# Patient Record
Sex: Female | Born: 1950 | Race: Black or African American | Hispanic: No | Marital: Single | Smoking: Current every day smoker
Health system: Southern US, Community
[De-identification: ages and names within clinical notes are randomized; demographics above are authoritative.]

## PROBLEM LIST (undated history)

## (undated) DIAGNOSIS — D126 Benign neoplasm of colon, unspecified: Secondary | ICD-10-CM

## (undated) DIAGNOSIS — I10 Essential (primary) hypertension: Secondary | ICD-10-CM

## (undated) DIAGNOSIS — K5790 Diverticulosis of intestine, part unspecified, without perforation or abscess without bleeding: Secondary | ICD-10-CM

## (undated) DIAGNOSIS — K759 Inflammatory liver disease, unspecified: Secondary | ICD-10-CM

## (undated) HISTORY — PX: TUBAL LIGATION: SHX77

---

## 2014-10-01 ENCOUNTER — Emergency Department
Admission: EM | Admit: 2014-10-01 | Discharge: 2014-10-01 | Disposition: A | Discharge status: Home / Self Care | Payer: No Typology Code available for payment source | Attending: Emergency Medicine | Admitting: Emergency Medicine

## 2014-10-01 ENCOUNTER — Encounter: Payer: Self-pay | Admitting: Emergency Medicine

## 2014-10-01 DIAGNOSIS — Z720 Tobacco use: Secondary | ICD-10-CM | POA: Diagnosis not present

## 2014-10-01 DIAGNOSIS — K625 Hemorrhage of anus and rectum: Secondary | ICD-10-CM | POA: Diagnosis present

## 2014-10-01 DIAGNOSIS — K649 Unspecified hemorrhoids: Secondary | ICD-10-CM | POA: Insufficient documentation

## 2014-10-01 MED ORDER — HYDROCORTISONE 2.5 % RE CREA: 1.0000 "application " | TOPICAL_CREAM | Freq: Two times a day (BID) | RECTAL | Status: DC

## 2014-10-01 NOTE — ED Notes (Signed)
Pt presents with constipation and some rectal bleeding since Monday, only when she wipes.

## 2014-10-01 NOTE — Discharge Instructions (Signed)
Please seek medical attention for any high fevers, chest pain, shortness of breath, change in behavior, persistent vomiting, bloody stool or any other new or concerning symptoms.    Hemorrhoids Hemorrhoids are swollen veins around the rectum or anus. There are two types of hemorrhoids:   Internal hemorrhoids. These occur in the veins just inside the rectum. They may poke through to the outside and become irritated and painful.  External hemorrhoids. These occur in the veins outside the anus and can be felt as a painful swelling or hard lump near the anus. CAUSES  Pregnancy.   Obesity.   Constipation or diarrhea.   Straining to have a bowel movement.   Sitting for long periods on the toilet.  Heavy lifting or other activity that caused you to strain.  Anal intercourse. SYMPTOMS   Pain.   Anal itching or irritation.   Rectal bleeding.   Fecal leakage.   Anal swelling.   One or more lumps around the anus.  DIAGNOSIS  Your caregiver may be able to diagnose hemorrhoids by visual examination. Other examinations or tests that may be performed include:   Examination of the rectal area with a gloved hand (digital rectal exam).   Examination of anal canal using a small tube (scope).   A blood test if you have lost a significant amount of blood.  A test to look inside the colon (sigmoidoscopy or colonoscopy). TREATMENT Most hemorrhoids can be treated at home. However, if symptoms do not seem to be getting better or if you have a lot of rectal bleeding, your caregiver may perform a procedure to help make the hemorrhoids get smaller or remove them completely. Possible treatments include:   Placing a rubber band at the base of the hemorrhoid to cut off the circulation (rubber band ligation).   Injecting a chemical to shrink the hemorrhoid (sclerotherapy).   Using a tool to burn the hemorrhoid (infrared light therapy).   Surgically removing the hemorrhoid  (hemorrhoidectomy).   Stapling the hemorrhoid to block blood flow to the tissue (hemorrhoid stapling).  HOME CARE INSTRUCTIONS   Eat foods with fiber, such as whole grains, beans, nuts, fruits, and vegetables. Ask your doctor about taking products with added fiber in them (fibersupplements).  Increase fluid intake. Drink enough water and fluids to keep your urine clear or pale yellow.   Exercise regularly.   Go to the bathroom when you have the urge to have a bowel movement. Do not wait.   Avoid straining to have bowel movements.   Keep the anal area dry and clean. Use wet toilet paper or moist towelettes after a bowel movement.   Medicated creams and suppositories may be used or applied as directed.   Only take over-the-counter or prescription medicines as directed by your caregiver.   Take warm sitz baths for 15-20 minutes, 3-4 times a day to ease pain and discomfort.   Place ice packs on the hemorrhoids if they are tender and swollen. Using ice packs between sitz baths may be helpful.   Put ice in a plastic bag.   Place a towel between your skin and the bag.   Leave the ice on for 15-20 minutes, 3-4 times a day.   Do not use a donut-shaped pillow or sit on the toilet for long periods. This increases blood pooling and pain.  SEEK MEDICAL CARE IF:  You have increasing pain and swelling that is not controlled by treatment or medicine.  You have uncontrolled bleeding.  You  have difficulty or you are unable to have a bowel movement.  You have pain or inflammation outside the area of the hemorrhoids. MAKE SURE YOU:  Understand these instructions.  Will watch your condition.  Will get help right away if you are not doing well or get worse. Document Released: 12/25/1999 Document Revised: 12/14/2011 Document Reviewed: 11/01/2011 [facility] Patient Information 2015 [place], [place]. This information is not intended to replace advice given to you by your health  care provider. Make sure you discuss any questions you have with your health care provider.

## 2014-10-01 NOTE — ED Provider Notes (Signed)
[facility] Emergency Department Provider Note   ____________________________________________  Time seen: 0810  I have reviewed the triage vital signs and the nursing notes.   HISTORY  Chief Complaint Rectal Bleeding and Constipation   History limited by: Not Limited   HPI Claudia Pittman is a 64 y.o. female who presents to the emergency department today with some concerns for GI bleed. Patient states she first noticed bright red blood on toilet paper and in her underwear on Monday. She denies any pain. She states that it is been a small amount. She denies having GI bleed in the past. She does admit to having issues with constipation. She does admit to straining whilst on the commode. She states that she does spend a prolonged time in the bathroom frequently. She does not have any nausea or vomiting. Denies any abdominal pain. Denies any fevers.   History reviewed. No pertinent past medical history.  There are no active problems to display for this patient.   History reviewed. No pertinent past surgical history.  No current outpatient prescriptions on file.  Allergies Review of patient's allergies indicates no known allergies.  No family history on file.  Social History Social History  Substance Use Topics  . Smoking status: Current Every Day Smoker  . Smokeless tobacco: None  . Alcohol Use: Yes    Review of Systems  Constitutional: Negative for fever. Cardiovascular: Negative for chest pain. Respiratory: Negative for shortness of breath. Gastrointestinal: Negative for abdominal pain, vomiting and diarrhea. Positive for rectal bleeding Genitourinary: Negative for dysuria. Musculoskeletal: Negative for back pain. Skin: Negative for rash. Neurological: Negative for headaches, focal weakness or numbness.  10-point ROS otherwise negative.  ____________________________________________   PHYSICAL EXAM:  VITAL SIGNS: ED Triage Vitals   Enc Vitals Group     BP 10/01/14 0750 161/103 mmHg     Pulse Rate 10/01/14 0750 71     Resp 10/01/14 0750 20     Temp 10/01/14 0750 98.9 F (37.2 C)     Temp Source 10/01/14 0750 Oral     SpO2 10/01/14 0750 97 %     Weight 10/01/14 0750 200 lb (90.719 kg)     Height 10/01/14 0750 5\' 3"  (1.6 m)   Constitutional: Alert and oriented. Well appearing and in no distress. Eyes: Conjunctivae are normal. PERRL. Normal extraocular movements. ENT   Head: Normocephalic and atraumatic.   Nose: No congestion/rhinnorhea.   Mouth/Throat: Mucous membranes are moist.   Neck: No stridor. Hematological/Lymphatic/Immunilogical: No cervical lymphadenopathy. Cardiovascular: Normal rate, regular rhythm.  No murmurs, rubs, or gallops. Respiratory: Normal respiratory effort without tachypnea nor retractions. Breath sounds are clear and equal bilaterally. No wheezes/rales/rhonchi. Gastrointestinal: Soft and nontender. No distention.  Rectal: Positive for external bleeding hemorrhoid. Genitourinary: Deferred Musculoskeletal: Normal range of motion in all extremities. No joint effusions.  No lower extremity tenderness nor edema. Neurologic:  Normal speech and language. No gross focal neurologic deficits are appreciated. Speech is normal.  Skin:  Skin is warm, dry and intact. No rash noted. Psychiatric: Mood and affect are normal. Speech and behavior are normal. Patient exhibits appropriate insight and judgment.  ____________________________________________    LABS (pertinent positives/negatives)  None  ____________________________________________   EKG  None  ____________________________________________    RADIOLOGY  None  ____________________________________________   PROCEDURES  Procedure(s) performed: None  Critical Care performed: No  ____________________________________________   INITIAL IMPRESSION / ASSESSMENT AND PLAN / ED COURSE  Pertinent labs & imaging  results that were available during  my care of the patient were reviewed by me and considered in my medical decision making (see chart for details).  Patient presents to the emergency department today with GI bleeding. Patient certainly does have bursitis for hemorrhoids. On physical exam patient did have a bleeding hemorrhoid. It was nontender. I discussed this with the patient. Discussed hemorrhoid care with patient.  ____________________________________________   FINAL CLINICAL IMPRESSION(S) / ED DIAGNOSES  Final diagnoses:  Bleeding hemorrhoid     Nance Pear, MD 10/01/14 1021

## 2015-04-15 ENCOUNTER — Other Ambulatory Visit: Payer: Self-pay | Admitting: Internal Medicine

## 2015-04-15 DIAGNOSIS — Z1239 Encounter for other screening for malignant neoplasm of breast: Secondary | ICD-10-CM

## 2015-04-15 DIAGNOSIS — R748 Abnormal levels of other serum enzymes: Secondary | ICD-10-CM

## 2015-04-23 ENCOUNTER — Ambulatory Visit
Admission: RE | Admit: 2015-04-23 | Discharge: 2015-04-23 | Disposition: A | Discharge status: Home / Self Care | Payer: Medicare Other | Source: Ambulatory Visit | Attending: Internal Medicine | Admitting: Internal Medicine

## 2015-04-23 DIAGNOSIS — R748 Abnormal levels of other serum enzymes: Secondary | ICD-10-CM | POA: Insufficient documentation

## 2015-04-23 DIAGNOSIS — R933 Abnormal findings on diagnostic imaging of other parts of digestive tract: Secondary | ICD-10-CM | POA: Diagnosis not present

## 2015-04-23 DIAGNOSIS — R932 Abnormal findings on diagnostic imaging of liver and biliary tract: Secondary | ICD-10-CM | POA: Diagnosis not present

## 2015-05-04 ENCOUNTER — Ambulatory Visit: Payer: No Typology Code available for payment source | Attending: Internal Medicine

## 2015-05-19 ENCOUNTER — Other Ambulatory Visit: Payer: Self-pay | Admitting: Gastroenterology

## 2015-05-19 DIAGNOSIS — R7989 Other specified abnormal findings of blood chemistry: Secondary | ICD-10-CM

## 2015-05-19 DIAGNOSIS — R945 Abnormal results of liver function studies: Principal | ICD-10-CM

## 2015-05-22 ENCOUNTER — Other Ambulatory Visit: Payer: No Typology Code available for payment source

## 2015-06-05 ENCOUNTER — Other Ambulatory Visit: Payer: Self-pay | Admitting: Gastroenterology

## 2015-06-05 ENCOUNTER — Ambulatory Visit
Admission: RE | Admit: 2015-06-05 | Discharge: 2015-06-05 | Disposition: A | Discharge status: Home / Self Care | Payer: Medicare Other | Source: Ambulatory Visit | Attending: Gastroenterology | Admitting: Gastroenterology

## 2015-06-05 DIAGNOSIS — R932 Abnormal findings on diagnostic imaging of liver and biliary tract: Secondary | ICD-10-CM | POA: Diagnosis not present

## 2015-06-05 DIAGNOSIS — B182 Chronic viral hepatitis C: Secondary | ICD-10-CM | POA: Insufficient documentation

## 2015-06-05 DIAGNOSIS — R945 Abnormal results of liver function studies: Secondary | ICD-10-CM | POA: Diagnosis not present

## 2015-06-05 DIAGNOSIS — R7989 Other specified abnormal findings of blood chemistry: Secondary | ICD-10-CM

## 2015-07-02 ENCOUNTER — Encounter: Payer: Self-pay | Admitting: *Deleted

## 2015-07-03 ENCOUNTER — Ambulatory Visit
Admission: RE | Admit: 2015-07-03 | Discharge: 2015-07-03 | Disposition: A | Discharge status: Home / Self Care | Payer: Medicare Other | Source: Ambulatory Visit | Attending: Gastroenterology | Admitting: Gastroenterology

## 2015-07-03 ENCOUNTER — Ambulatory Visit: Payer: Medicare Other | Admitting: Anesthesiology

## 2015-07-03 ENCOUNTER — Encounter
Admission: RE | Disposition: A | Discharge status: Home / Self Care | Payer: Self-pay | Source: Ambulatory Visit | Attending: Gastroenterology

## 2015-07-03 DIAGNOSIS — D125 Benign neoplasm of sigmoid colon: Secondary | ICD-10-CM | POA: Diagnosis not present

## 2015-07-03 DIAGNOSIS — D121 Benign neoplasm of appendix: Secondary | ICD-10-CM | POA: Insufficient documentation

## 2015-07-03 DIAGNOSIS — I10 Essential (primary) hypertension: Secondary | ICD-10-CM | POA: Diagnosis not present

## 2015-07-03 DIAGNOSIS — D123 Benign neoplasm of transverse colon: Secondary | ICD-10-CM | POA: Insufficient documentation

## 2015-07-03 DIAGNOSIS — Z8371 Family history of colonic polyps: Secondary | ICD-10-CM | POA: Diagnosis not present

## 2015-07-03 DIAGNOSIS — Z1211 Encounter for screening for malignant neoplasm of colon: Secondary | ICD-10-CM | POA: Insufficient documentation

## 2015-07-03 DIAGNOSIS — K635 Polyp of colon: Secondary | ICD-10-CM | POA: Diagnosis not present

## 2015-07-03 DIAGNOSIS — D122 Benign neoplasm of ascending colon: Secondary | ICD-10-CM | POA: Diagnosis not present

## 2015-07-03 DIAGNOSIS — F17200 Nicotine dependence, unspecified, uncomplicated: Secondary | ICD-10-CM | POA: Insufficient documentation

## 2015-07-03 DIAGNOSIS — K5730 Diverticulosis of large intestine without perforation or abscess without bleeding: Secondary | ICD-10-CM | POA: Diagnosis not present

## 2015-07-03 HISTORY — PX: COLONOSCOPY WITH PROPOFOL: SHX5780

## 2015-07-03 HISTORY — DX: Inflammatory liver disease, unspecified: K75.9

## 2015-07-03 HISTORY — DX: Essential (primary) hypertension: I10

## 2015-07-03 LAB — CBC
HCT: 43.3 % (ref 35.0–47.0)
Hemoglobin: 14.6 g/dL (ref 12.0–16.0)
MCH: 32.6 pg (ref 26.0–34.0)
MCHC: 33.6 g/dL (ref 32.0–36.0)
MCV: 97.1 fL (ref 80.0–100.0)
PLATELETS: 136 10*3/uL — AB (ref 150–440)
RBC: 4.46 MIL/uL (ref 3.80–5.20)
RDW: 13.8 % (ref 11.5–14.5)
WBC: 6.5 10*3/uL (ref 3.6–11.0)

## 2015-07-03 LAB — PROTIME-INR
INR: 1.14
PROTHROMBIN TIME: 14.8 s (ref 11.4–15.0)

## 2015-07-03 SURGERY — COLONOSCOPY WITH PROPOFOL
Anesthesia: General

## 2015-07-03 MED ORDER — LIDOCAINE HCL (PF) 2 % IJ SOLN
INTRAMUSCULAR | Status: DC | PRN
Start: 2015-07-03 — End: 2015-07-03
  Administered 2015-07-03 (×2): 100 mg via INTRADERMAL

## 2015-07-03 MED ORDER — PHENYLEPHRINE HCL 10 MG/ML IJ SOLN
INTRAMUSCULAR | Status: DC | PRN
  Administered 2015-07-03: 100 ug via INTRAVENOUS

## 2015-07-03 MED ORDER — PROPOFOL 10 MG/ML IV BOLUS
INTRAVENOUS | Status: DC | PRN
  Administered 2015-07-03: 70 mg via INTRAVENOUS

## 2015-07-03 MED ORDER — SODIUM CHLORIDE 0.9 % IV SOLN
INTRAVENOUS | Status: DC
  Administered 2015-07-03: 1000 mL via INTRAVENOUS

## 2015-07-03 MED ORDER — SODIUM CHLORIDE 0.9 % IV SOLN: INTRAVENOUS | Status: DC

## 2015-07-03 MED ORDER — PROPOFOL 500 MG/50ML IV EMUL
INTRAVENOUS | Status: DC | PRN
  Administered 2015-07-03: 125 ug/kg/min via INTRAVENOUS

## 2015-07-03 NOTE — Anesthesia Preprocedure Evaluation (Addendum)
Anesthesia Evaluation  Patient identified by MRN, date of birth, ID band Patient awake    Reviewed: Allergy & Precautions, NPO status , Patient's Chart, lab work & pertinent test results  History of Anesthesia Complications Negative for: history of anesthetic complications  Airway Mallampati: III       Dental  (+) Upper Dentures, Lower Dentures   Pulmonary neg pulmonary ROS, Current Smoker,           Cardiovascular hypertension, Pt. on medications      Neuro/Psych negative neurological ROS     GI/Hepatic negative GI ROS, (+) Hepatitis -, C  Endo/Other  negative endocrine ROS  Renal/GU negative Renal ROS     Musculoskeletal   Abdominal   Peds  Hematology negative hematology ROS (+)   Anesthesia Other Findings   Reproductive/Obstetrics                            Anesthesia Physical Anesthesia Plan  ASA: II  Anesthesia Plan: General   Post-op Pain Management:    Induction: Intravenous  Airway Management Planned: Nasal Cannula  Additional Equipment:   Intra-op Plan:   Post-operative Plan:   Informed Consent: I have reviewed the patients History and Physical, chart, labs and discussed the procedure including the risks, benefits and alternatives for the proposed anesthesia with the patient or authorized representative who has indicated his/her understanding and acceptance.     Plan Discussed with:   Anesthesia Plan Comments:         Anesthesia Quick Evaluation

## 2015-07-03 NOTE — Transfer of Care (Signed)
Immediate Anesthesia Transfer of Care Note  Patient: Claudia Pittman  Procedure(s) Performed: Procedure(s): COLONOSCOPY WITH PROPOFOL (N/A)  Patient Location: PACU  Anesthesia Type:General  Level of Consciousness: sedated  Airway & Oxygen Therapy: Patient Spontanous Breathing and Patient connected to nasal cannula oxygen  Post-op Assessment: Report given to RN and Post -op Vital signs reviewed and stable  Post vital signs: Reviewed and stable  Last Vitals:  Filed Vitals:   07/03/15 0853 07/03/15 1142  BP: 114/75 112/76  Pulse: 70 62  Temp: 35.9 C   Resp: 18 18    Last Pain: There were no vitals filed for this visit.       Complications: No apparent anesthesia complications

## 2015-07-03 NOTE — Anesthesia Postprocedure Evaluation (Signed)
Anesthesia Post Note  Patient: Claudia Pittman  Procedure(s) Performed: Procedure(s) (LRB): COLONOSCOPY WITH PROPOFOL (N/A)  Patient location during evaluation: Endoscopy Anesthesia Type: General Level of consciousness: awake and alert Pain management: pain level controlled Vital Signs Assessment: post-procedure vital signs reviewed and stable Respiratory status: spontaneous breathing and respiratory function stable Cardiovascular status: stable Anesthetic complications: no    Last Vitals:  Filed Vitals:   07/03/15 1210 07/03/15 1220  BP: 116/94 146/98  Pulse: 63 56  Temp:    Resp: 15 14    Last Pain:  Filed Vitals:   07/03/15 1220  PainSc: Asleep                 Maddilyn Campus K

## 2015-07-03 NOTE — H&P (Signed)
Outpatient short stay form Pre-procedure 07/03/2015 10:25 AM Lollie Sails MD  Primary Physician: Dr. Glendon Axe  Reason for visit:  Colonoscopy  History of present illness:  Patient is a 65 year old female presenting today for colonoscopy. She has a family history of colon polyps in both. She tolerated her prep well. She takes no aspirin or blood thinning products.    Current facility-administered medications:  .  0.9 %  sodium chloride infusion, , Intravenous, Continuous, Lollie Sails, MD, Last Rate: 20 mL/hr at 07/03/15 1015, 1,000 mL at 07/03/15 1015 .  0.9 %  sodium chloride infusion, , Intravenous, Continuous, Lollie Sails, MD  Prescriptions prior to admission  Medication Sig Dispense Refill Last Dose  . amLODipine (NORVASC) 5 MG tablet Take 5 mg by mouth daily.   07/03/2015 at 0530  . Dorzolamide HCl-Timolol Mal PF 22.3-6.8 MG/ML SOLN Place 1 drop into both eyes 2 (two) times daily.   10/01/2014 at am  . latanoprost (XALATAN) 0.005 % ophthalmic solution Place 1 drop into both eyes at bedtime.   09/30/2014 at pm     No Known Allergies   Past Medical History  Diagnosis Date  . Hepatitis   . Hypertension     Review of systems:      Physical Exam    Heart and lungs: Regular rate and rhythm without rub or gallop, lungs are bilaterally clear.    HEENT: Normocephalic atraumatic eyes are anicteric    Other:     Pertinant exam for procedure: Soft nontender nondistended bowel sounds positive normoactive.    Planned proceedures: Colonoscopy and indicated procedures.  I have discussed the risks benefits and complications of procedures to include not limited to bleeding, infection, perforation and the risk of sedation and the patient wishes to proceed.    Lollie Sails, MD Gastroenterology 07/03/2015  10:25 AM

## 2015-07-03 NOTE — Op Note (Signed)
[facility] Gastroenterology Patient Name: Claudia Pittman Procedure Date: 07/03/2015 10:33 AM MRN: [id number] Account #: [id number] Date of Birth: 1950/11/24 Admit Type: Outpatient Age: 65 Room: [facility] ENDO ROOM 4 Gender: Female Note Status: Finalized Procedure:            Colonoscopy Indications:          Screening for colorectal malignant neoplasm, This is                        the patient's first colonoscopy, Family history of                        colonic polyps in a first-degree relative Providers:            Lollie Sails, MD Referring MD:         Glendon Axe (Referring MD) Medicines:            Monitored Anesthesia Care Complications:        No immediate complications. Procedure:            Pre-Anesthesia Assessment:                       - ASA Grade Assessment: II - A patient with mild                        systemic disease.                       After obtaining informed consent, the colonoscope was                        passed under direct vision. Throughout the procedure,                        the patient's blood pressure, pulse, and oxygen                        saturations were monitored continuously. The                        Colonoscope was introduced through the anus and                        advanced to the the cecum, identified by appendiceal                        orifice and ileocecal valve. The colonoscopy was                        performed with moderate difficulty. The patient                        tolerated the procedure well. The quality of the bowel                        preparation was good. Findings:      A 5 mm polyp was found in the splenic flexure. The polyp was sessile.       The polyp was removed with a cold biopsy forceps. Resection and       retrieval were complete.  A 2 mm polyp was found in the splenic flexure. The polyp was sessile.       The polyp was removed with a cold biopsy forceps. Resection and     retrieval were complete.      A 5 mm polyp was found in the distal transverse colon. The polyp was       sessile. The polyp was removed with a cold snare. Resection and       retrieval were complete.      A 38 mm polyp was found in the hepatic flexure. The polyp was       carpet-like and sessile. Polypectomy was attempted, initially using a       hot snare. Polyp resection was incomplete with this device. This       intervention then required a different device and polypectomy technique.       The polyp was removed with a cold biopsy forceps. Resection and       retrieval were complete. To prevent bleeding after the polypectomy, two       hemostatic clips were successfully placed. There was no bleeding at the       end of the maneuver.      Two sessile polyps were found in the appendiceal orifice. The polyps       were less than 1 mm in size. These polyps were removed with a cold       biopsy forceps. Resection and retrieval were complete.      A 3 mm polyp was found in the proximal ascending colon. The polyp was       sessile. The polyp was removed with a cold snare. Resection and       retrieval were complete.      Ten sessile polyps were found in the recto-sigmoid colon. The polyps       were 1 to 3 mm in size. These polyps were removed with a cold biopsy       forceps. Resection and retrieval were complete.      A few small-mouthed diverticula were found in the sigmoid colon and       descending colon.      The digital rectal exam was normal. Impression:           - One 5 mm polyp at the splenic flexure, removed with a                        cold biopsy forceps. Resected and retrieved.                       - One 2 mm polyp at the splenic flexure, removed with a                        cold biopsy forceps. Resected and retrieved.                       - One 5 mm polyp in the distal transverse colon,                        removed with a cold snare. Resected and retrieved.                        - One 38 mm polyp at the hepatic flexure, removed with  a cold biopsy forceps. Resected and retrieved. Clips                        were placed.                       - Two less than 1 mm polyps at the appendiceal orifice,                        removed with a cold biopsy forceps. Resected and                        retrieved.                       - One 3 mm polyp in the proximal ascending colon,                        removed with a cold snare. Resected and retrieved.                       - Ten 1 to 3 mm polyps at the recto-sigmoid colon,                        removed with a cold biopsy forceps. Resected and                        retrieved.                       - Diverticulosis in the sigmoid colon and in the                        descending colon. Recommendation:       - Await pathology results.                       - Telephone GI clinic for pathology results in 1 week. Procedure Code(s):    --- Professional ---                       [phone], Colonoscopy, flexible; with removal of tumor(s),                        polyp(s), or other lesion(s) by snare technique                       [id number], 54, Colonoscopy, flexible; with biopsy, single                        or multiple Diagnosis Code(s):    --- Professional ---                       Z12.11, Encounter for screening for malignant neoplasm                        of colon                       D12.3, Benign neoplasm of transverse colon (hepatic  flexure or splenic flexure)                       D12.2, Benign neoplasm of ascending colon                       D12.1, Benign neoplasm of appendix                       D12.7, Benign neoplasm of rectosigmoid junction                       Z83.71, Family history of colonic polyps                       K57.30, Diverticulosis of large intestine without                        perforation or abscess without bleeding CPT copyright 2016  American Medical Association. All rights reserved. The codes documented in this report are preliminary and upon coder review may  be revised to meet current compliance requirements. Lollie Sails, MD 07/03/2015 11:47:11 AM This report has been signed electronically. Number of Addenda: 0 Note Initiated On: 07/03/2015 10:33 AM Scope Withdrawal Time: 0 hours 38 minutes 0 seconds  Total Procedure Duration: 0 hours 58 minutes 0 seconds       [facility]

## 2015-07-03 NOTE — OR Nursing (Signed)
[place] ink applied to Hepatic flexure site 4 ml.

## 2015-07-06 ENCOUNTER — Encounter: Payer: Self-pay | Admitting: Gastroenterology

## 2015-07-07 LAB — SURGICAL PATHOLOGY

## 2015-08-20 ENCOUNTER — Other Ambulatory Visit: Payer: Self-pay | Admitting: Gastroenterology

## 2015-08-20 DIAGNOSIS — R945 Abnormal results of liver function studies: Principal | ICD-10-CM

## 2015-08-20 DIAGNOSIS — R7989 Other specified abnormal findings of blood chemistry: Secondary | ICD-10-CM

## 2015-08-27 ENCOUNTER — Other Ambulatory Visit: Payer: Self-pay | Admitting: General Surgery

## 2015-08-27 ENCOUNTER — Ambulatory Visit: Payer: Medicare Other

## 2015-08-28 ENCOUNTER — Other Ambulatory Visit: Payer: Self-pay | Admitting: Physician Assistant

## 2015-08-31 ENCOUNTER — Ambulatory Visit
Admission: RE | Admit: 2015-08-31 | Discharge: 2015-08-31 | Disposition: A | Discharge status: Home / Self Care | Payer: Medicare Other | Source: Ambulatory Visit | Attending: Gastroenterology | Admitting: Gastroenterology

## 2015-08-31 DIAGNOSIS — K7581 Nonalcoholic steatohepatitis (NASH): Secondary | ICD-10-CM | POA: Diagnosis not present

## 2015-08-31 DIAGNOSIS — B182 Chronic viral hepatitis C: Secondary | ICD-10-CM | POA: Diagnosis not present

## 2015-08-31 DIAGNOSIS — R7989 Other specified abnormal findings of blood chemistry: Secondary | ICD-10-CM

## 2015-08-31 DIAGNOSIS — R945 Abnormal results of liver function studies: Secondary | ICD-10-CM | POA: Diagnosis present

## 2015-08-31 LAB — CBC
HEMATOCRIT: 40.9 % (ref 35.0–47.0)
Hemoglobin: 13.7 g/dL (ref 12.0–16.0)
MCH: 32.9 pg (ref 26.0–34.0)
MCHC: 33.5 g/dL (ref 32.0–36.0)
MCV: 98.1 fL (ref 80.0–100.0)
PLATELETS: 112 10*3/uL — AB (ref 150–440)
RBC: 4.17 MIL/uL (ref 3.80–5.20)
RDW: 13.4 % (ref 11.5–14.5)
WBC: 6 10*3/uL (ref 3.6–11.0)

## 2015-08-31 LAB — APTT: aPTT: 35 seconds (ref 24–36)

## 2015-08-31 LAB — PROTIME-INR
INR: 1.01
Prothrombin Time: 13.3 seconds (ref 11.4–15.2)

## 2015-08-31 MED ORDER — SODIUM CHLORIDE 0.9 % IV SOLN
INTRAVENOUS | Status: DC
  Administered 2015-08-31: 09:00:00 via INTRAVENOUS

## 2015-08-31 MED ORDER — HYDROCODONE-ACETAMINOPHEN 5-325 MG PO TABS: 1.0000 | ORAL_TABLET | ORAL | Status: DC | PRN

## 2015-08-31 NOTE — Procedures (Signed)
US liver biopsy  Complications:  None  Blood Loss: none  See dictation in canopy pacs  

## 2015-09-03 LAB — SURGICAL PATHOLOGY

## 2015-09-17 ENCOUNTER — Ambulatory Visit: Payer: Medicare Other

## 2015-10-02 ENCOUNTER — Other Ambulatory Visit: Payer: Self-pay | Admitting: Internal Medicine

## 2015-10-02 ENCOUNTER — Ambulatory Visit
Admission: RE | Admit: 2015-10-02 | Discharge: 2015-10-02 | Disposition: A | Discharge status: Home / Self Care | Payer: Medicare Other | Source: Ambulatory Visit | Attending: Internal Medicine | Admitting: Internal Medicine

## 2015-10-02 DIAGNOSIS — Z1231 Encounter for screening mammogram for malignant neoplasm of breast: Secondary | ICD-10-CM | POA: Insufficient documentation

## 2015-10-02 DIAGNOSIS — Z1239 Encounter for other screening for malignant neoplasm of breast: Secondary | ICD-10-CM

## 2015-10-20 ENCOUNTER — Ambulatory Visit: Admit: 2015-10-20 | Payer: Medicare Other | Admitting: Gastroenterology

## 2015-10-20 SURGERY — COLONOSCOPY
Anesthesia: General

## 2015-10-29 ENCOUNTER — Encounter: Payer: Self-pay | Admitting: *Deleted

## 2015-10-30 ENCOUNTER — Encounter
Admission: RE | Disposition: A | Discharge status: Home / Self Care | Payer: Self-pay | Source: Ambulatory Visit | Attending: Gastroenterology

## 2015-10-30 ENCOUNTER — Ambulatory Visit
Admission: RE | Admit: 2015-10-30 | Discharge: 2015-10-30 | Disposition: A | Discharge status: Home / Self Care | Payer: Medicare Other | Source: Ambulatory Visit | Attending: Gastroenterology | Admitting: Gastroenterology

## 2015-10-30 ENCOUNTER — Ambulatory Visit: Payer: Medicare Other | Admitting: Anesthesiology

## 2015-10-30 ENCOUNTER — Encounter: Payer: Self-pay | Admitting: *Deleted

## 2015-10-30 DIAGNOSIS — F17200 Nicotine dependence, unspecified, uncomplicated: Secondary | ICD-10-CM | POA: Insufficient documentation

## 2015-10-30 DIAGNOSIS — D124 Benign neoplasm of descending colon: Secondary | ICD-10-CM | POA: Insufficient documentation

## 2015-10-30 DIAGNOSIS — B182 Chronic viral hepatitis C: Secondary | ICD-10-CM | POA: Insufficient documentation

## 2015-10-30 DIAGNOSIS — K621 Rectal polyp: Secondary | ICD-10-CM | POA: Diagnosis not present

## 2015-10-30 DIAGNOSIS — K5730 Diverticulosis of large intestine without perforation or abscess without bleeding: Secondary | ICD-10-CM | POA: Diagnosis not present

## 2015-10-30 DIAGNOSIS — D123 Benign neoplasm of transverse colon: Secondary | ICD-10-CM | POA: Insufficient documentation

## 2015-10-30 DIAGNOSIS — Z1211 Encounter for screening for malignant neoplasm of colon: Secondary | ICD-10-CM | POA: Insufficient documentation

## 2015-10-30 DIAGNOSIS — K7460 Unspecified cirrhosis of liver: Secondary | ICD-10-CM | POA: Insufficient documentation

## 2015-10-30 DIAGNOSIS — Z79899 Other long term (current) drug therapy: Secondary | ICD-10-CM | POA: Diagnosis not present

## 2015-10-30 DIAGNOSIS — I10 Essential (primary) hypertension: Secondary | ICD-10-CM | POA: Insufficient documentation

## 2015-10-30 DIAGNOSIS — K635 Polyp of colon: Secondary | ICD-10-CM | POA: Insufficient documentation

## 2015-10-30 DIAGNOSIS — Z86010 Personal history of colonic polyps: Secondary | ICD-10-CM | POA: Insufficient documentation

## 2015-10-30 HISTORY — PX: COLONOSCOPY: SHX5424

## 2015-10-30 HISTORY — DX: Diverticulosis of intestine, part unspecified, without perforation or abscess without bleeding: K57.90

## 2015-10-30 SURGERY — COLONOSCOPY
Anesthesia: General

## 2015-10-30 MED ORDER — FENTANYL CITRATE (PF) 100 MCG/2ML IJ SOLN
INTRAMUSCULAR | Status: DC | PRN
  Administered 2015-10-30: 50 ug via INTRAVENOUS

## 2015-10-30 MED ORDER — SODIUM CHLORIDE 0.9 % IV SOLN
INTRAVENOUS | Status: DC
  Administered 2015-10-30: 1000 mL via INTRAVENOUS
  Administered 2015-10-30: 10:00:00 via INTRAVENOUS

## 2015-10-30 MED ORDER — PROPOFOL 10 MG/ML IV BOLUS
INTRAVENOUS | Status: DC | PRN
  Administered 2015-10-30 (×2): 20 mg via INTRAVENOUS

## 2015-10-30 MED ORDER — SPOT INK MARKER SYRINGE KIT
PACK | SUBMUCOSAL | Status: DC | PRN
  Administered 2015-10-30: 2 mL via SUBMUCOSAL

## 2015-10-30 MED ORDER — PHENYLEPHRINE HCL 10 MG/ML IJ SOLN
INTRAMUSCULAR | Status: DC | PRN
  Administered 2015-10-30 (×4): 100 ug via INTRAVENOUS

## 2015-10-30 MED ORDER — LIDOCAINE HCL (CARDIAC) 20 MG/ML IV SOLN
INTRAVENOUS | Status: DC | PRN
  Administered 2015-10-30: 40 mg via INTRATRACHEAL
  Administered 2015-10-30: 200 mg via INTRATRACHEAL

## 2015-10-30 MED ORDER — MIDAZOLAM HCL 2 MG/2ML IJ SOLN
INTRAMUSCULAR | Status: DC | PRN
  Administered 2015-10-30: .5 mg via INTRAVENOUS

## 2015-10-30 MED ORDER — SODIUM CHLORIDE 0.9 % IV SOLN: INTRAVENOUS | Status: DC

## 2015-10-30 MED ORDER — PROPOFOL 500 MG/50ML IV EMUL
INTRAVENOUS | Status: DC | PRN
  Administered 2015-10-30: 140 ug/kg/min via INTRAVENOUS

## 2015-10-30 NOTE — Anesthesia Procedure Notes (Signed)
Date/Time: 10/30/2015 10:20 AM Performed by: Allean Found Pre-anesthesia Checklist: Patient being monitored, Timeout performed, Suction available, Emergency Drugs available and Patient identified Patient Re-evaluated:Patient Re-evaluated prior to inductionOxygen Delivery Method: Nasal cannula Placement Confirmation: positive ETCO2

## 2015-10-30 NOTE — Anesthesia Preprocedure Evaluation (Signed)
Anesthesia Evaluation  Patient identified by MRN, date of birth, ID band Patient awake    Reviewed: Allergy & Precautions, H&P , NPO status , Patient's Chart, lab work & pertinent test results  History of Anesthesia Complications Negative for: history of anesthetic complications  Airway Mallampati: III  TM Distance: <3 FB Neck ROM: limited    Dental  (+) Poor Dentition, Missing, Upper Dentures, Lower Dentures   Pulmonary Current Smoker,    Pulmonary exam normal breath sounds clear to auscultation       Cardiovascular Exercise Tolerance: Good hypertension, (-) angina(-) Past MI and (-) DOE Normal cardiovascular exam Rhythm:regular Rate:Normal     Neuro/Psych negative neurological ROS  negative psych ROS   GI/Hepatic negative GI ROS, neg GERD  ,(+) Hepatitis -, C  Endo/Other  negative endocrine ROS  Renal/GU negative Renal ROS  negative genitourinary   Musculoskeletal   Abdominal   Peds  Hematology negative hematology ROS (+)   Anesthesia Other Findings Past Medical History: No date: Diverticulosis No date: Hepatitis     Comment: hep c No date: Hypertension  Past Surgical History: 07/03/2015: COLONOSCOPY WITH PROPOFOL N/A     Comment: Procedure: COLONOSCOPY WITH PROPOFOL;                Surgeon: Lollie Sails, MD;  Location: [facility]              ENDOSCOPY;  Service: Endoscopy;  Laterality:               N/A; 1980s: TUBAL LIGATION     Reproductive/Obstetrics negative OB ROS                             Anesthesia Physical Anesthesia Plan  ASA: III  Anesthesia Plan: General   Post-op Pain Management:    Induction:   Airway Management Planned:   Additional Equipment:   Intra-op Plan:   Post-operative Plan:   Informed Consent: I have reviewed the patients History and Physical, chart, labs and discussed the procedure including the risks, benefits and alternatives for the  proposed anesthesia with the patient or authorized representative who has indicated his/her understanding and acceptance.   Dental Advisory Given  Plan Discussed with: Anesthesiologist, CRNA and Surgeon  Anesthesia Plan Comments:         Anesthesia Quick Evaluation

## 2015-10-30 NOTE — Op Note (Signed)
[facility] Gastroenterology Patient Name: Claudia Pittman Procedure Date: 10/30/2015 10:16 AM MRN: [id number] Account #: [id number] Date of Birth: 1950-06-04 Admit Type: Outpatient Age: 65 Room: [facility] ENDO ROOM 3 Gender: Female Note Status: Finalized Procedure:            Colonoscopy Indications:          Personal history of colonic polyps Providers:            Lollie Sails, MD Referring MD:         Glendon Axe (Referring MD) Medicines:            Monitored Anesthesia Care Complications:        No immediate complications. Procedure:            Pre-Anesthesia Assessment:                       - ASA Grade Assessment: III - A patient with severe                        systemic disease.                       After obtaining informed consent, the colonoscope was                        passed under direct vision. Throughout the procedure,                        the patient's blood pressure, pulse, and oxygen                        saturations were monitored continuously. The                        Colonoscope was introduced through the anus and                        advanced to the the cecum, identified by appendiceal                        orifice and ileocecal valve. The colonoscopy was                        performed without difficulty. The patient tolerated the                        procedure well. The quality of the bowel preparation                        was good. Findings:      A 2 mm polyp was found in the descending colon. The polyp was sessile.       The polyp was removed with a cold biopsy forceps. Resection and       retrieval were complete.      A 3 mm polyp was found in the proximal transverse colon. The polyp was       sessile. The polyp was removed with a cold biopsy forceps. Resection and       retrieval were complete.      A 4 mm polyp was found in the hepatic flexure. The polyp was flat. The  polyp was removed with a cold snare.  Resection and retrieval were       complete.      A polypectomy scar is found in the hepatic flexure, with an adjacent ink       marking. There was no evidence of recurrent polyp, howeverr some mucose       appeared erythematous and prominant. The was removed with a cold snare       and forcep. Area was re-tattooed with an injection of 2 mL of [place] ink.      Two sessile polyps were found in the hepatic flexure. The polyps were 1       to 2 mm in size. These polyps were removed with a cold biopsy forceps.       Resection and retrieval were complete.      Three sessile polyps were found in the proximal descending colon. The       polyps were 1 to 2 mm in size. These polyps were removed with a cold       biopsy forceps. Resection and retrieval were complete.      A 1 mm polyp was found in the sigmoid colon. The polyp was sessile. The       polyp was removed with a cold biopsy forceps. Resection and retrieval       were complete.      Five sessile polyps were found in the rectum. The polyps were 1 to 2 mm       in size. These polyps were removed with a cold biopsy forceps. Resection       and retrieval were complete.      A 4 mm polyp was found in the rectum. The polyp was sessile. The polyp       was removed with a cold snare. Resection and retrieval were complete.      A few small-mouthed diverticula were found in the sigmoid colon and       descending colon.      The digital rectal exam was normal.      A single small localized angioectasia without bleeding was found in the       proximal transverse colon. Impression:           - One 2 mm polyp in the descending colon, removed with                        a cold biopsy forceps. Resected and retrieved.                       - One 3 mm polyp in the proximal transverse colon,                        removed with a cold biopsy forceps. Resected and                        retrieved.                       - One 4 mm polyp at the hepatic  flexure, removed with a                        cold snare. Resected and retrieved.                       -  Two 1 to 2 mm polyps at the hepatic flexure, removed                        with a cold biopsy forceps. Resected and retrieved.                       - Three 1 to 2 mm polyps in the proximal descending                        colon, removed with a cold biopsy forceps. Resected and                        retrieved.                       - One 1 mm polyp in the sigmoid colon, removed with a                        cold biopsy forceps. Resected and retrieved.                       - Five 1 to 2 mm polyps in the rectum, removed with a                        cold biopsy forceps. Resected and retrieved.                       - One 4 mm polyp in the rectum, removed with a cold                        snare. Resected and retrieved.                       - Diverticulosis in the sigmoid colon and in the                        descending colon. Recommendation:       - Return to GI clinic in 4 weeks.                       - Await pathology results. Procedure Code(s):    --- Professional ---                       [phone], Colonoscopy, flexible; with removal of tumor(s),                        polyp(s), or other lesion(s) by snare technique                       45380, 59, Colonoscopy, flexible; with biopsy, single                        or multiple                       45381, Colonoscopy, flexible; with directed submucosal                        injection(s), any substance Diagnosis Code(s):    --- Professional ---  D12.4, Benign neoplasm of descending colon                       D12.3, Benign neoplasm of transverse colon (hepatic                        flexure or splenic flexure)                       D12.5, Benign neoplasm of sigmoid colon                       K62.1, Rectal polyp                       Z86.010, Personal history of colonic polyps                       K57.30,  Diverticulosis of large intestine without                        perforation or abscess without bleeding CPT copyright 2016 American Medical Association. All rights reserved. The codes documented in this report are preliminary and upon coder review may  be revised to meet current compliance requirements. Lollie Sails, MD 10/30/2015 11:30:29 AM This report has been signed electronically. Number of Addenda: 0 Note Initiated On: 10/30/2015 10:16 AM Scope Withdrawal Time: 0 hours 35 minutes 14 seconds  Total Procedure Duration: 0 hours 47 minutes 26 seconds       [facility]

## 2015-10-30 NOTE — H&P (Signed)
Outpatient short stay form Pre-procedure 10/30/2015 10:17 AM Lollie Sails MD  Primary Physician: Dr. Glendon Axe  Reason for visit:  Colonoscopy  History of present illness:  Patient is a 65 year old female presenting today as above. She had a colonoscopy on 07/03/2015 with multiple colon polyps being removed. One of these polyps was a sessile adenoma showing some high-grade dysplasia. Since been removed entirely with cautery. She is returning today for a recheck of this area. He tolerated her prep well. She denies use of any blood thinning agents or aspirin products.  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 He will be started on treatment for the hepatitis C after this procedure.    Current Facility-Administered Medications:  .  0.9 %  sodium chloride infusion, , Intravenous, Continuous, Lollie Sails, MD, Last Rate: 20 mL/hr at 10/30/15 0954, 1,000 mL at 10/30/15 0954 .  0.9 %  sodium chloride infusion, , Intravenous, Continuous, Lollie Sails, MD  Prescriptions Prior to Admission  Medication Sig Dispense Refill Last Dose  . amLODipine (NORVASC) 5 MG tablet Take 5 mg by mouth daily.   10/30/2015 at 0700  . Ledipasvir-Sofosbuvir (HARVONI) 90-400 MG TABS Take 1 tablet by mouth daily.     . sucralfate (CARAFATE) 1 GM/10ML suspension Take 1 g by mouth 2 (two) times daily.     Marland Kitchen alum & mag hydroxide-simeth (MAALOX/MYLANTA) 200-200-20 MG/5ML suspension Take 15 mLs by mouth 3 (three) times daily.   08/30/2015 at Unknown time  . Dorzolamide HCl-Timolol Mal PF 22.3-6.8 MG/ML SOLN Place 1 drop into both eyes 2 (two) times daily.   08/31/2015 at Unknown time  . latanoprost (XALATAN) 0.005 % ophthalmic solution Place 1 drop into both eyes at bedtime.   08/31/2015 at Unknown time     No Known Allergies   Past Medical History:  Diagnosis Date  . Diverticulosis   . Hepatitis    hep c  . Hypertension      Review of systems:      Physical Exam    Heart and lungs: Regular rate and rhythm without rub or gallop, lungs are bilaterally clear.    HEENT: Normocephalic atraumatic eyes are anicteric    Other:     Pertinant exam for procedure: Soft nontender nondistended bowel sounds positive normoactive.    Planned proceedures: Colonoscopy and indicated procedures. I have discussed the risks benefits and complications of procedures to include not limited to bleeding, infection, perforation and the risk of sedation and the patient wishes to proceed.    Lollie Sails, MD Gastroenterology 10/30/2015  10:17 AM

## 2015-10-30 NOTE — Transfer of Care (Signed)
Immediate Anesthesia Transfer of Care Note  Patient: Claudia Pittman  Procedure(s) Performed: Procedure(s): COLONOSCOPY (N/A)  Patient Location: PACU  Anesthesia Type:General  Level of Consciousness: awake  Airway & Oxygen Therapy: Patient Spontanous Breathing and Patient connected to nasal cannula oxygen  Post-op Assessment: Report given to RN and Post -op Vital signs reviewed and stable  Post vital signs: Reviewed and stable  Last Vitals:  Vitals:   10/30/15 0935 10/30/15 1130  BP: (!) 145/86 (!) 109/96  Pulse: 72 60  Resp: 18 17  Temp: 36.1 C 36.6 C    Last Pain:  Vitals:   10/30/15 1130  TempSrc: Tympanic         Complications: No apparent anesthesia complications

## 2015-10-31 ENCOUNTER — Encounter: Payer: Self-pay | Admitting: Gastroenterology

## 2015-10-31 NOTE — Anesthesia Postprocedure Evaluation (Signed)
Anesthesia Post Note  Patient: Claudia Pittman  Procedure(s) Performed: Procedure(s) (LRB): COLONOSCOPY (N/A)  Patient location during evaluation: Endoscopy Anesthesia Type: General Level of consciousness: awake and alert Pain management: pain level controlled Vital Signs Assessment: post-procedure vital signs reviewed and stable Respiratory status: spontaneous breathing, nonlabored ventilation, respiratory function stable and patient connected to nasal cannula oxygen Cardiovascular status: blood pressure returned to baseline and stable Postop Assessment: no signs of nausea or vomiting Anesthetic complications: no    Last Vitals:  Vitals:   10/30/15 1150 10/30/15 1200  BP: 140/63 (!) 154/81  Pulse: (!) 56 (!) 56  Resp: 11 16  Temp:      Last Pain:  Vitals:   10/30/15 1130  TempSrc: Tympanic                 Precious Haws Tekeyah Santiago

## 2015-11-02 LAB — SURGICAL PATHOLOGY

## 2016-11-18 ENCOUNTER — Other Ambulatory Visit: Payer: Self-pay | Admitting: Gastroenterology

## 2016-11-18 DIAGNOSIS — K7460 Unspecified cirrhosis of liver: Secondary | ICD-10-CM

## 2016-11-22 ENCOUNTER — Ambulatory Visit
Admission: RE | Admit: 2016-11-22 | Discharge: 2016-11-22 | Disposition: A | Discharge status: Home / Self Care | Payer: Medicare Other | Source: Ambulatory Visit | Attending: Gastroenterology | Admitting: Gastroenterology

## 2016-11-22 DIAGNOSIS — K7460 Unspecified cirrhosis of liver: Secondary | ICD-10-CM

## 2017-08-24 ENCOUNTER — Other Ambulatory Visit: Payer: Self-pay | Admitting: Gastroenterology

## 2017-08-24 DIAGNOSIS — K7460 Unspecified cirrhosis of liver: Secondary | ICD-10-CM

## 2017-08-28 ENCOUNTER — Ambulatory Visit: Payer: Medicare HMO

## 2017-08-31 ENCOUNTER — Ambulatory Visit
Admission: RE | Admit: 2017-08-31 | Discharge: 2017-08-31 | Disposition: A | Discharge status: Home / Self Care | Payer: Medicare HMO | Source: Ambulatory Visit | Attending: Gastroenterology | Admitting: Gastroenterology

## 2017-08-31 DIAGNOSIS — K7460 Unspecified cirrhosis of liver: Secondary | ICD-10-CM | POA: Diagnosis present

## 2017-10-18 ENCOUNTER — Encounter: Payer: Self-pay | Admitting: *Deleted

## 2017-10-19 ENCOUNTER — Encounter
Admission: RE | Disposition: A | Discharge status: Home / Self Care | Payer: Self-pay | Source: Ambulatory Visit | Attending: Gastroenterology

## 2017-10-19 ENCOUNTER — Ambulatory Visit: Payer: Medicare HMO | Admitting: Anesthesiology

## 2017-10-19 ENCOUNTER — Encounter: Payer: Self-pay | Admitting: *Deleted

## 2017-10-19 ENCOUNTER — Ambulatory Visit
Admission: RE | Admit: 2017-10-19 | Discharge: 2017-10-19 | Disposition: A | Discharge status: Home / Self Care | Payer: Medicare HMO | Source: Ambulatory Visit | Attending: Gastroenterology | Admitting: Gastroenterology

## 2017-10-19 DIAGNOSIS — Z79899 Other long term (current) drug therapy: Secondary | ICD-10-CM | POA: Diagnosis not present

## 2017-10-19 DIAGNOSIS — K449 Diaphragmatic hernia without obstruction or gangrene: Secondary | ICD-10-CM | POA: Diagnosis not present

## 2017-10-19 DIAGNOSIS — K7460 Unspecified cirrhosis of liver: Secondary | ICD-10-CM | POA: Diagnosis not present

## 2017-10-19 DIAGNOSIS — K222 Esophageal obstruction: Secondary | ICD-10-CM | POA: Diagnosis not present

## 2017-10-19 DIAGNOSIS — K210 Gastro-esophageal reflux disease with esophagitis: Secondary | ICD-10-CM | POA: Insufficient documentation

## 2017-10-19 DIAGNOSIS — Z1211 Encounter for screening for malignant neoplasm of colon: Secondary | ICD-10-CM | POA: Diagnosis present

## 2017-10-19 DIAGNOSIS — Z86010 Personal history of colonic polyps: Secondary | ICD-10-CM | POA: Diagnosis not present

## 2017-10-19 DIAGNOSIS — K2950 Unspecified chronic gastritis without bleeding: Secondary | ICD-10-CM | POA: Insufficient documentation

## 2017-10-19 DIAGNOSIS — K5730 Diverticulosis of large intestine without perforation or abscess without bleeding: Secondary | ICD-10-CM | POA: Diagnosis not present

## 2017-10-19 DIAGNOSIS — K3189 Other diseases of stomach and duodenum: Secondary | ICD-10-CM | POA: Diagnosis not present

## 2017-10-19 DIAGNOSIS — D120 Benign neoplasm of cecum: Secondary | ICD-10-CM | POA: Diagnosis not present

## 2017-10-19 DIAGNOSIS — K621 Rectal polyp: Secondary | ICD-10-CM | POA: Diagnosis not present

## 2017-10-19 DIAGNOSIS — Z8719 Personal history of other diseases of the digestive system: Secondary | ICD-10-CM | POA: Insufficient documentation

## 2017-10-19 DIAGNOSIS — I10 Essential (primary) hypertension: Secondary | ICD-10-CM | POA: Insufficient documentation

## 2017-10-19 DIAGNOSIS — B1920 Unspecified viral hepatitis C without hepatic coma: Secondary | ICD-10-CM | POA: Diagnosis not present

## 2017-10-19 HISTORY — PX: COLONOSCOPY WITH PROPOFOL: SHX5780

## 2017-10-19 HISTORY — PX: ESOPHAGOGASTRODUODENOSCOPY: SHX5428

## 2017-10-19 LAB — URINE DRUG SCREEN, QUALITATIVE (ARMC ONLY)
Amphetamines, Ur Screen: NOT DETECTED
BARBITURATES, UR SCREEN: NOT DETECTED
BENZODIAZEPINE, UR SCRN: NOT DETECTED
Cannabinoid 50 Ng, Ur ~~LOC~~: POSITIVE — AB
Cocaine Metabolite,Ur ~~LOC~~: NOT DETECTED
MDMA (Ecstasy)Ur Screen: NOT DETECTED
Methadone Scn, Ur: NOT DETECTED
Opiate, Ur Screen: NOT DETECTED
PHENCYCLIDINE (PCP) UR S: NOT DETECTED
TRICYCLIC, UR SCREEN: NOT DETECTED

## 2017-10-19 SURGERY — EGD (ESOPHAGOGASTRODUODENOSCOPY)
Anesthesia: General

## 2017-10-19 MED ORDER — PROPOFOL 500 MG/50ML IV EMUL
INTRAVENOUS | Status: AC
  Filled 2017-10-19: qty 50

## 2017-10-19 MED ORDER — SODIUM CHLORIDE 0.9 % IV SOLN: INTRAVENOUS | Status: DC

## 2017-10-19 MED ORDER — LIDOCAINE HCL (CARDIAC) PF 100 MG/5ML IV SOSY
PREFILLED_SYRINGE | INTRAVENOUS | Status: DC | PRN
  Administered 2017-10-19: 30 mg via INTRAVENOUS

## 2017-10-19 MED ORDER — FENTANYL CITRATE (PF) 100 MCG/2ML IJ SOLN
INTRAMUSCULAR | Status: DC | PRN
  Administered 2017-10-19: 50 ug via INTRAVENOUS

## 2017-10-19 MED ORDER — MIDAZOLAM HCL 2 MG/2ML IJ SOLN
INTRAMUSCULAR | Status: AC
  Filled 2017-10-19: qty 2

## 2017-10-19 MED ORDER — MIDAZOLAM HCL 2 MG/2ML IJ SOLN
INTRAMUSCULAR | Status: DC | PRN
  Administered 2017-10-19: 1 mg via INTRAVENOUS

## 2017-10-19 MED ORDER — SODIUM CHLORIDE 0.9 % IV SOLN
INTRAVENOUS | Status: DC
  Administered 2017-10-19: 1000 mL via INTRAVENOUS

## 2017-10-19 MED ORDER — FENTANYL CITRATE (PF) 100 MCG/2ML IJ SOLN
INTRAMUSCULAR | Status: AC
  Filled 2017-10-19: qty 2

## 2017-10-19 MED ORDER — LIDOCAINE HCL (PF) 2 % IJ SOLN
INTRAMUSCULAR | Status: AC
  Filled 2017-10-19: qty 10

## 2017-10-19 MED ORDER — EPHEDRINE SULFATE 50 MG/ML IJ SOLN
INTRAMUSCULAR | Status: AC
  Filled 2017-10-19: qty 1

## 2017-10-19 MED ORDER — PROPOFOL 500 MG/50ML IV EMUL
INTRAVENOUS | Status: DC | PRN
  Administered 2017-10-19: 100 ug/kg/min via INTRAVENOUS

## 2017-10-19 MED ORDER — EPHEDRINE SULFATE 50 MG/ML IJ SOLN
INTRAMUSCULAR | Status: DC | PRN
  Administered 2017-10-19 (×2): 5 mg via INTRAVENOUS

## 2017-10-19 NOTE — Op Note (Signed)
[facility] Gastroenterology Patient Name: Claudia Pittman Procedure Date: 10/19/2017 11:07 AM MRN: [id number] Account #: [id number] Date of Birth: [birth year] Admit Type: Outpatient Age: 67 Room: [facility] ENDO ROOM 1 Gender: Female Note Status: Finalized Procedure:            Colonoscopy Indications:          Personal history of colonic polyps Providers:            Lollie Sails, MD Referring MD:         Glendon Axe (Referring MD) Medicines:            Monitored Anesthesia Care Complications:        No immediate complications. Procedure:            Pre-Anesthesia Assessment:                       - ASA Grade Assessment: III - A patient with severe                        systemic disease.                       After obtaining informed consent, the colonoscope was                        passed under direct vision. Throughout the procedure,                        the patient's blood pressure, pulse, and oxygen                        saturations were monitored continuously. The                        Colonoscope was introduced through the anus and                        advanced to the the cecum, identified by appendiceal                        orifice and ileocecal valve. The colonoscopy was                        performed without difficulty. The patient tolerated the                        procedure well. The quality of the bowel preparation                        was good. Findings:      A 1 mm polyp was found in the cecum. The polyp was sessile. The polyp       was removed with a cold biopsy forceps. Resection and retrieval were       complete.      Five sessile polyps were found in the recto-sigmoid colon. The polyps       were 1 to 2 mm in size. These polyps were removed with a cold biopsy       forceps. Resection and retrieval were complete.      A few small-mouthed diverticula were found in the sigmoid colon and  descending colon.      A tattoo  was seen at the hepatic flexure. A post-polypectomy scar was       found at the tattoo site. There was no evidence of residual polyp tissue. Impression:           - One 1 mm polyp in the cecum, removed with a cold                        biopsy forceps. Resected and retrieved.                       - Five 1 to 2 mm polyps at the recto-sigmoid colon,                        removed with a cold biopsy forceps. Resected and                        retrieved.                       - Diverticulosis in the sigmoid colon and in the                        descending colon.                       - A tattoo was seen at the hepatic flexure. A                        post-polypectomy scar was found at the tattoo site.                        There was no evidence of residual polyp tissue. Recommendation:       - Discharge patient to home.                       - Repeat colonoscopy in 5 years for surveillance.                       - Telephone GI clinic for pathology results in 1 week. Procedure Code(s):    --- Professional ---                       [phone], Colonoscopy, flexible; with biopsy, single or                        multiple Diagnosis Code(s):    --- Professional ---                       D12.0, Benign neoplasm of cecum                       D12.7, Benign neoplasm of rectosigmoid junction                       Z86.010, Personal history of colonic polyps                       K57.30, Diverticulosis of large intestine without  perforation or abscess without bleeding CPT copyright 2018 American Medical Association. All rights reserved. The codes documented in this report are preliminary and upon coder review may  be revised to meet current compliance requirements. Lollie Sails, MD 10/19/2017 11:53:13 AM This report has been signed electronically. Number of Addenda: 0 Note Initiated On: 10/19/2017 11:07 AM Scope Withdrawal Time: 0 hours 8 minutes 38 seconds  Total  Procedure Duration: 0 hours 15 minutes 44 seconds       [facility]

## 2017-10-19 NOTE — Anesthesia Procedure Notes (Signed)
Performed by: Cook-Martin, Jaylie Neaves Pre-anesthesia Checklist: Emergency Drugs available, Patient identified, Suction available, Patient being monitored and Timeout performed Patient Re-evaluated:Patient Re-evaluated prior to induction Oxygen Delivery Method: Nasal cannula Preoxygenation: Pre-oxygenation with 100% oxygen Induction Type: IV induction Airway Equipment and Method: Bite block Placement Confirmation: positive ETCO2 and CO2 detector       

## 2017-10-19 NOTE — Transfer of Care (Signed)
Immediate Anesthesia Transfer of Care Note  Patient: Claudia Pittman  Procedure(s) Performed: ESOPHAGOGASTRODUODENOSCOPY (EGD) (N/A ) COLONOSCOPY WITH PROPOFOL (N/A )  Patient Location: PACU  Anesthesia Type:General  Level of Consciousness: awake and sedated  Airway & Oxygen Therapy: Patient Spontanous Breathing and Patient connected to nasal cannula oxygen  Post-op Assessment: Report given to RN and Post -op Vital signs reviewed and stable  Post vital signs: Reviewed and stable  Last Vitals:  Vitals Value Taken Time  BP    Temp    Pulse    Resp    SpO2      Last Pain:  Vitals:   10/19/17 0945  TempSrc: Tympanic  PainSc: 0-No pain         Complications: No apparent anesthesia complications

## 2017-10-19 NOTE — Anesthesia Postprocedure Evaluation (Signed)
Anesthesia Post Note  Patient: Claudia Pittman  Procedure(s) Performed: ESOPHAGOGASTRODUODENOSCOPY (EGD) (N/A ) COLONOSCOPY WITH PROPOFOL (N/A )  Patient location during evaluation: Endoscopy Anesthesia Type: General Level of consciousness: awake and alert Pain management: pain level controlled Vital Signs Assessment: post-procedure vital signs reviewed and stable Respiratory status: spontaneous breathing, nonlabored ventilation, respiratory function stable and patient connected to nasal cannula oxygen Cardiovascular status: blood pressure returned to baseline and stable Postop Assessment: no apparent nausea or vomiting Anesthetic complications: no     Last Vitals:  Vitals:   10/19/17 0945 10/19/17 1155  BP: 138/85 130/81  Pulse: 62   Resp: 18   Temp: (!) 35.9 C (!) 36.1 C  SpO2: 99%     Last Pain:  Vitals:   10/19/17 1215  TempSrc:   PainSc: 0-No pain                 Precious Haws Piscitello

## 2017-10-19 NOTE — H&P (Signed)
Outpatient short stay form Pre-procedure 10/19/2017 11:05 AM Claudia Sails MD  Primary Physician: Dr. Glendon Axe  Reason for visit: EGD and colonoscopy  History of present illness: Patient is a 67 year old female presenting today as above.  She has a recent diagnosis of cirrhosis of the liver.  Also she has a story of a polyp being removed from the hepatic flexure in the setting of multiple polyps throughout the colon however that particular one had a high-grade dysplasia.  That was rechecked several months after it was removed found to be normal.  There is a tattoo in that area.  Is presenting today for both the upper scope for variceal check and lower scope due to the history of polyps.  She tolerated her prep well.  She denies taking any aspirin or blood thinning agent.  Her coags were checked recently and found to have a INR of 1.1 a platelet count of 154.  She has been treated successfully for hepatitis C.    Current Facility-Administered Medications:  .  0.9 %  sodium chloride infusion, , Intravenous, Continuous, Claudia Sails, MD .  0.9 %  sodium chloride infusion, , Intravenous, Continuous, Claudia Sails, MD, Last Rate: 20 mL/hr at 10/19/17 1005, 1,000 mL at 10/19/17 1005  Medications Prior to Admission  Medication Sig Dispense Refill Last Dose  . alum & mag hydroxide-simeth (MAALOX/MYLANTA) 200-200-20 MG/5ML suspension Take 15 mLs by mouth 3 (three) times daily.   Past Week at Unknown time  . amLODipine (NORVASC) 5 MG tablet Take 5 mg by mouth daily.   10/19/2017 at 0905  . Cholecalciferol 1000 units capsule Take 1,000 Units by mouth daily.   10/18/2017 at Unknown time  . Dorzolamide HCl-Timolol Mal PF 22.3-6.8 MG/ML SOLN Place 1 drop into both eyes 2 (two) times daily.   10/18/2017 at Unknown time  . latanoprost (XALATAN) 0.005 % ophthalmic solution Place 1 drop into both eyes at bedtime.   10/18/2017 at Unknown time  . Ledipasvir-Sofosbuvir (HARVONI) 90-400 MG TABS  Take 1 tablet by mouth daily.   Completed Course at Unknown time  . sucralfate (CARAFATE) 1 GM/10ML suspension Take 1 g by mouth 2 (two) times daily.   Not Taking at Unknown time     No Known Allergies   Past Medical History:  Diagnosis Date  . Diverticulosis   . Hepatitis    hep c  . Hypertension     Review of systems:      Physical Exam    Heart and lungs: Rhythm without rub or gallop, lungs are bilaterally clear.    HEENT: Normal cephalic atraumatic eyes are anicteric    Other:     Pertinant exam for procedure: Soft nontender nondistended bowel sounds positive normoactive    Planned proceedures: EGD, colonoscopy and indicated procedures. I have discussed the risks benefits and complications of procedures to include not limited to bleeding, infection, perforation and the risk of sedation and the patient wishes to proceed.    Claudia Sails, MD Gastroenterology 10/19/2017  11:05 AM

## 2017-10-19 NOTE — Op Note (Signed)
[facility] Gastroenterology Patient Name: Claudia Pittman Procedure Date: 10/19/2017 11:09 AM MRN: [id number] Account #: [id number] Date of Birth: 1950-08-07 Admit Type: Outpatient Age: 67 Room: [facility] ENDO ROOM 1 Gender: Female Note Status: Finalized Procedure:            Upper GI endoscopy Indications:          Cirrhosis rule out esophageal varices Providers:            Lollie Sails, MD Referring MD:         Glendon Axe (Referring MD) Medicines:            Monitored Anesthesia Care Complications:        No immediate complications. Procedure:            Pre-Anesthesia Assessment:                       - ASA Grade Assessment: III - A patient with severe                        systemic disease.                       After obtaining informed consent, the endoscope was                        passed under direct vision. Throughout the procedure,                        the patient's blood pressure, pulse, and oxygen                        saturations were monitored continuously. The Endoscope                        was introduced through the mouth, and advanced to the                        third part of duodenum. The upper GI endoscopy was                        accomplished without difficulty. The patient tolerated                        the procedure well. Findings:      The Z-line was variable.      LA Grade A (one or more mucosal breaks less than 5 mm, not extending       between tops of 2 mucosal folds) esophagitis with no bleeding was found.       Biopsies were taken with a cold forceps for histology.      The exam of the esophagus was otherwise normal, no evidence of varices..      A small hiatal hernia was present.      A widely patent and non-obstructing Schatzki ring was found at the       gastroesophageal junction.      Patchy minimal inflammation characterized by erosions and erythema was       found in the gastric body and in the gastric  antrum. Biopsies were taken       with a cold forceps for histology. Biopsies were taken with a cold  forceps for Helicobacter pylori testing.      A single 9 mm mucosal papule (nodule) with no bleeding and no stigmata       of recent bleeding was found in the prepyloric region of the stomach.       Biopsies were taken with a cold forceps for histology.      The examined duodenum was normal.      Two diminutive angioectasias without bleeding were found in the second       portion of the duodenum. Impression:           - Z-line variable.                       - LA Grade A reflux esophagitis. Biopsied.                       - Small hiatal hernia.                       - Widely patent and non-obstructing Schatzki ring.                       - Gastritis. Biopsied.                       - A single mucosal papule (nodule) found in the                        stomach. Biopsied. Recommendation:       - Use Prilosec (omeprazole) 20 mg PO daily daily.                       - Return to GI clinic in 6 weeks. Procedure Code(s):    --- Professional ---                       [phone], Esophagogastroduodenoscopy, flexible, transoral;                        with biopsy, single or multiple Diagnosis Code(s):    --- Professional ---                       K22.8, Other specified diseases of esophagus                       K21.0, Gastro-esophageal reflux disease with esophagitis                       K44.9, Diaphragmatic hernia without obstruction or                        gangrene                       K22.2, Esophageal obstruction                       K29.70, Gastritis, unspecified, without bleeding                       K31.89, Other diseases of stomach and duodenum                       K74.60, Unspecified cirrhosis  of liver CPT copyright 2018 American Medical Association. All rights reserved. The codes documented in this report are preliminary and upon coder review may  be revised to meet current  compliance requirements. Lollie Sails, MD 10/19/2017 11:30:52 AM This report has been signed electronically. Number of Addenda: 0 Note Initiated On: 10/19/2017 11:09 AM      [place]

## 2017-10-19 NOTE — Anesthesia Preprocedure Evaluation (Signed)
Anesthesia Evaluation  Patient identified by MRN, date of birth, ID band Patient awake    Reviewed: Allergy & Precautions, H&P , NPO status , Patient's Chart, lab work & pertinent test results, reviewed documented beta blocker date and time   History of Anesthesia Complications Negative for: history of anesthetic complications  Airway Mallampati: I  TM Distance: >3 FB Neck ROM: full    Dental  (+) Dental Advidsory Given, Edentulous Upper, Edentulous Lower   Pulmonary neg shortness of breath, neg COPD, neg recent URI, Current Smoker,           Cardiovascular Exercise Tolerance: Good hypertension, (-) angina(-) CAD, (-) Past MI, (-) Cardiac Stents and (-) CABG (-) dysrhythmias (-) Valvular Problems/Murmurs     Neuro/Psych negative neurological ROS  negative psych ROS   GI/Hepatic negative GI ROS, (+) Hepatitis - (s/p treatment), C  Endo/Other  negative endocrine ROS  Renal/GU negative Renal ROS  negative genitourinary   Musculoskeletal   Abdominal   Peds  Hematology negative hematology ROS (+)   Anesthesia Other Findings Past Medical History: No date: Diverticulosis No date: Hepatitis     Comment:  hep c No date: Hypertension   Reproductive/Obstetrics negative OB ROS                             Anesthesia Physical Anesthesia Plan  ASA: III  Anesthesia Plan: General   Post-op Pain Management:    Induction: Intravenous  PONV Risk Score and Plan: 2 and Propofol infusion and TIVA  Airway Management Planned: Natural Airway and Nasal Cannula  Additional Equipment:   Intra-op Plan:   Post-operative Plan:   Informed Consent: I have reviewed the patients History and Physical, chart, labs and discussed the procedure including the risks, benefits and alternatives for the proposed anesthesia with the patient or authorized representative who has indicated his/her understanding and  acceptance.   Dental Advisory Given  Plan Discussed with: Anesthesiologist, CRNA and Surgeon  Anesthesia Plan Comments:         Anesthesia Quick Evaluation

## 2017-10-19 NOTE — Anesthesia Post-op Follow-up Note (Signed)
Anesthesia QCDR form completed.        

## 2017-10-20 ENCOUNTER — Encounter: Payer: Self-pay | Admitting: Gastroenterology

## 2017-10-20 LAB — SURGICAL PATHOLOGY

## 2018-06-25 ENCOUNTER — Other Ambulatory Visit: Payer: Self-pay | Admitting: Internal Medicine

## 2018-06-25 DIAGNOSIS — Z1231 Encounter for screening mammogram for malignant neoplasm of breast: Secondary | ICD-10-CM

## 2018-06-26 ENCOUNTER — Other Ambulatory Visit: Payer: Self-pay | Admitting: Gastroenterology

## 2018-06-26 DIAGNOSIS — K7460 Unspecified cirrhosis of liver: Secondary | ICD-10-CM

## 2018-06-29 ENCOUNTER — Other Ambulatory Visit: Payer: Self-pay

## 2018-06-29 ENCOUNTER — Ambulatory Visit
Admission: RE | Admit: 2018-06-29 | Discharge: 2018-06-29 | Disposition: A | Discharge status: Home / Self Care | Payer: Medicare HMO | Source: Ambulatory Visit | Attending: Gastroenterology | Admitting: Gastroenterology

## 2018-06-29 DIAGNOSIS — K7460 Unspecified cirrhosis of liver: Secondary | ICD-10-CM | POA: Diagnosis present

## 2018-08-03 ENCOUNTER — Ambulatory Visit
Admission: RE | Admit: 2018-08-03 | Discharge: 2018-08-03 | Disposition: A | Discharge status: Home / Self Care | Payer: Medicare HMO | Source: Ambulatory Visit | Attending: Internal Medicine | Admitting: Internal Medicine

## 2018-08-03 DIAGNOSIS — Z1231 Encounter for screening mammogram for malignant neoplasm of breast: Secondary | ICD-10-CM | POA: Insufficient documentation

## 2019-05-21 ENCOUNTER — Other Ambulatory Visit: Payer: Self-pay | Admitting: Gastroenterology

## 2019-05-21 DIAGNOSIS — K7460 Unspecified cirrhosis of liver: Secondary | ICD-10-CM

## 2019-05-31 ENCOUNTER — Other Ambulatory Visit: Payer: Self-pay

## 2019-05-31 ENCOUNTER — Ambulatory Visit
Admission: RE | Admit: 2019-05-31 | Discharge: 2019-05-31 | Disposition: A | Discharge status: Home / Self Care | Payer: Medicare HMO | Source: Ambulatory Visit | Attending: Gastroenterology | Admitting: Gastroenterology

## 2019-05-31 DIAGNOSIS — K7460 Unspecified cirrhosis of liver: Secondary | ICD-10-CM | POA: Insufficient documentation

## 2019-06-20 ENCOUNTER — Other Ambulatory Visit: Payer: Self-pay | Admitting: Internal Medicine

## 2019-06-20 DIAGNOSIS — Z1231 Encounter for screening mammogram for malignant neoplasm of breast: Secondary | ICD-10-CM

## 2019-06-24 ENCOUNTER — Telehealth: Payer: Self-pay | Admitting: *Deleted

## 2019-06-24 DIAGNOSIS — Z122 Encounter for screening for malignant neoplasm of respiratory organs: Secondary | ICD-10-CM

## 2019-06-24 DIAGNOSIS — Z87891 Personal history of nicotine dependence: Secondary | ICD-10-CM

## 2019-06-24 NOTE — Telephone Encounter (Signed)
Received referral for low dose lung cancer screening CT scan. Message left at phone number listed in EMR for patient to call me back to facilitate scheduling scan.  

## 2019-06-25 NOTE — Telephone Encounter (Signed)
Received referral for initial lung cancer screening scan. Contacted patient and obtained smoking history,(current, 102 pack year) as well as answering questions related to screening process. Patient denies signs of lung cancer such as weight loss or hemoptysis. Patient denies comorbidity that would prevent curative treatment if lung cancer were found. Patient is scheduled for shared decision making visit and CT scan on 07/10/19 at 1045am.

## 2019-06-25 NOTE — Addendum Note (Signed)
Addended by: Lieutenant Diego on: 06/25/2019 11:18 AM   Modules accepted: Orders

## 2019-07-10 ENCOUNTER — Inpatient Hospital Stay: Payer: Medicare HMO | Attending: Oncology | Admitting: Oncology

## 2019-07-10 ENCOUNTER — Encounter: Payer: Self-pay | Admitting: Oncology

## 2019-07-10 ENCOUNTER — Other Ambulatory Visit: Payer: Self-pay

## 2019-07-10 ENCOUNTER — Ambulatory Visit
Admission: RE | Admit: 2019-07-10 | Discharge: 2019-07-10 | Disposition: A | Discharge status: Home / Self Care | Payer: Medicare HMO | Source: Ambulatory Visit | Attending: Oncology | Admitting: Oncology

## 2019-07-10 DIAGNOSIS — Z87891 Personal history of nicotine dependence: Secondary | ICD-10-CM | POA: Insufficient documentation

## 2019-07-10 DIAGNOSIS — Z122 Encounter for screening for malignant neoplasm of respiratory organs: Secondary | ICD-10-CM | POA: Diagnosis present

## 2019-07-10 NOTE — Progress Notes (Signed)
Virtual Visit via Video Note  I connected with Claudia Pittman on 07/10/19 at 10:45 AM EDT by a video enabled telemedicine application and verified that I am speaking with the correct person using two identifiers.  Location: Patient: [facility] Provider: Clinic    I discussed the limitations of evaluation and management by telemedicine and the availability of in person appointments. The patient expressed understanding and agreed to proceed.  I discussed the assessment and treatment plan with the patient. The patient was provided an opportunity to ask questions and all were answered. The patient agreed with the plan and demonstrated an understanding of the instructions.   The patient was advised to call back or seek an in-person evaluation if the symptoms worsen or if the condition fails to improve as anticipated.   In accordance with CMS guidelines, patient has met eligibility criteria including age, absence of signs or symptoms of lung cancer.  Social History   Tobacco Use  . Smoking status: Current Every Day Smoker    Packs/day: 2.00    Years: 51.00    Pack years: 102.00    Types: Cigarettes  . Smokeless tobacco: Never Used  Vaping Use  . Vaping Use: Never used  Substance Use Topics  . Alcohol use: Yes  . Drug use: No      A shared decision-making session was conducted prior to the performance of CT scan. This includes one or more decision aids, includes benefits and harms of screening, follow-up diagnostic testing, over-diagnosis, false positive rate, and total radiation exposure.   Counseling on the importance of adherence to annual lung cancer LDCT screening, impact of co-morbidities, and ability or willingness to undergo diagnosis and treatment is imperative for compliance of the program.   Counseling on the importance of continued smoking cessation for former smokers; the importance of smoking cessation for current smokers, and information about tobacco cessation interventions  have been given to patient including [place] and [place] quit [place] programs.   Written order for lung cancer screening with LDCT has been given to the patient and any and all questions have been answered to the best of my abilities.    Yearly follow up will be coordinated by Burgess Estelle, Thoracic Navigator.  I provided 15 minutes of face-to-face video visit time during this encounter, and > 50% was spent counseling as documented under my assessment & plan.   Jacquelin Hawking, NP

## 2019-07-12 ENCOUNTER — Encounter: Payer: Self-pay | Admitting: *Deleted

## 2019-08-05 ENCOUNTER — Ambulatory Visit
Admission: RE | Admit: 2019-08-05 | Discharge: 2019-08-05 | Disposition: A | Discharge status: Home / Self Care | Payer: Medicare HMO | Source: Ambulatory Visit | Attending: Internal Medicine | Admitting: Internal Medicine

## 2019-08-05 DIAGNOSIS — Z1231 Encounter for screening mammogram for malignant neoplasm of breast: Secondary | ICD-10-CM | POA: Diagnosis present

## 2019-08-08 ENCOUNTER — Other Ambulatory Visit: Payer: Self-pay | Admitting: Internal Medicine

## 2019-08-08 DIAGNOSIS — N6489 Other specified disorders of breast: Secondary | ICD-10-CM

## 2019-08-08 DIAGNOSIS — R928 Other abnormal and inconclusive findings on diagnostic imaging of breast: Secondary | ICD-10-CM

## 2019-08-19 ENCOUNTER — Other Ambulatory Visit: Payer: Self-pay

## 2019-08-19 ENCOUNTER — Ambulatory Visit
Admission: RE | Admit: 2019-08-19 | Discharge: 2019-08-19 | Disposition: A | Discharge status: Home / Self Care | Payer: Medicare HMO | Source: Ambulatory Visit | Attending: Internal Medicine | Admitting: Internal Medicine

## 2019-08-19 DIAGNOSIS — N6489 Other specified disorders of breast: Secondary | ICD-10-CM | POA: Diagnosis present

## 2019-08-19 DIAGNOSIS — R928 Other abnormal and inconclusive findings on diagnostic imaging of breast: Secondary | ICD-10-CM | POA: Insufficient documentation

## 2019-08-22 ENCOUNTER — Other Ambulatory Visit: Payer: Self-pay | Admitting: Internal Medicine

## 2019-08-22 DIAGNOSIS — N6310 Unspecified lump in the right breast, unspecified quadrant: Secondary | ICD-10-CM

## 2019-08-22 DIAGNOSIS — R928 Other abnormal and inconclusive findings on diagnostic imaging of breast: Secondary | ICD-10-CM

## 2019-09-03 ENCOUNTER — Other Ambulatory Visit: Payer: Self-pay

## 2019-09-03 ENCOUNTER — Other Ambulatory Visit
Admission: RE | Admit: 2019-09-03 | Discharge: 2019-09-03 | Disposition: A | Discharge status: Home / Self Care | Payer: Medicare HMO | Source: Ambulatory Visit | Attending: Internal Medicine | Admitting: Internal Medicine

## 2019-09-03 DIAGNOSIS — Z01812 Encounter for preprocedural laboratory examination: Secondary | ICD-10-CM | POA: Diagnosis present

## 2019-09-03 DIAGNOSIS — Z20822 Contact with and (suspected) exposure to covid-19: Secondary | ICD-10-CM | POA: Insufficient documentation

## 2019-09-03 LAB — SARS CORONAVIRUS 2 (TAT 6-24 HRS): SARS Coronavirus 2: NEGATIVE

## 2019-09-04 ENCOUNTER — Encounter: Payer: Self-pay | Admitting: Internal Medicine

## 2019-09-04 MED ORDER — SODIUM CHLORIDE 0.9 % IV SOLN: INTRAVENOUS | Status: DC

## 2019-09-05 ENCOUNTER — Encounter
Admission: RE | Disposition: A | Discharge status: Home / Self Care | Payer: Self-pay | Source: Home / Self Care | Attending: Internal Medicine

## 2019-09-05 ENCOUNTER — Ambulatory Visit: Payer: Medicare HMO | Admitting: Certified Registered"

## 2019-09-05 ENCOUNTER — Other Ambulatory Visit: Payer: Self-pay

## 2019-09-05 ENCOUNTER — Encounter: Payer: Self-pay | Admitting: Internal Medicine

## 2019-09-05 ENCOUNTER — Ambulatory Visit
Admission: RE | Admit: 2019-09-05 | Discharge: 2019-09-05 | Disposition: A | Discharge status: Home / Self Care | Payer: Medicare HMO | Attending: Internal Medicine | Admitting: Internal Medicine

## 2019-09-05 DIAGNOSIS — Z79899 Other long term (current) drug therapy: Secondary | ICD-10-CM | POA: Insufficient documentation

## 2019-09-05 DIAGNOSIS — B1920 Unspecified viral hepatitis C without hepatic coma: Secondary | ICD-10-CM | POA: Diagnosis not present

## 2019-09-05 DIAGNOSIS — I10 Essential (primary) hypertension: Secondary | ICD-10-CM | POA: Insufficient documentation

## 2019-09-05 DIAGNOSIS — K31819 Angiodysplasia of stomach and duodenum without bleeding: Secondary | ICD-10-CM | POA: Insufficient documentation

## 2019-09-05 DIAGNOSIS — K7460 Unspecified cirrhosis of liver: Secondary | ICD-10-CM | POA: Diagnosis not present

## 2019-09-05 DIAGNOSIS — K766 Portal hypertension: Secondary | ICD-10-CM | POA: Diagnosis present

## 2019-09-05 DIAGNOSIS — Z86010 Personal history of colonic polyps: Secondary | ICD-10-CM | POA: Diagnosis not present

## 2019-09-05 DIAGNOSIS — I8510 Secondary esophageal varices without bleeding: Secondary | ICD-10-CM | POA: Insufficient documentation

## 2019-09-05 HISTORY — DX: Benign neoplasm of colon, unspecified: D12.6

## 2019-09-05 HISTORY — PX: ESOPHAGOGASTRODUODENOSCOPY: SHX5428

## 2019-09-05 SURGERY — EGD (ESOPHAGOGASTRODUODENOSCOPY)
Anesthesia: General

## 2019-09-05 MED ORDER — PROPOFOL 500 MG/50ML IV EMUL
INTRAVENOUS | Status: AC
  Filled 2019-09-05: qty 50

## 2019-09-05 MED ORDER — LIDOCAINE HCL (CARDIAC) PF 100 MG/5ML IV SOSY
PREFILLED_SYRINGE | INTRAVENOUS | Status: DC | PRN
  Administered 2019-09-05: 50 mg via INTRAVENOUS

## 2019-09-05 MED ORDER — PROPOFOL 10 MG/ML IV BOLUS
INTRAVENOUS | Status: DC | PRN
  Administered 2019-09-05 (×2): 50 mg via INTRAVENOUS

## 2019-09-05 MED ORDER — GLYCOPYRROLATE 0.2 MG/ML IJ SOLN
INTRAMUSCULAR | Status: AC
  Filled 2019-09-05: qty 1

## 2019-09-05 MED ORDER — GLYCOPYRROLATE 0.2 MG/ML IJ SOLN
INTRAMUSCULAR | Status: DC | PRN
  Administered 2019-09-05: .2 mg via INTRAVENOUS

## 2019-09-05 MED ORDER — LIDOCAINE HCL (PF) 2 % IJ SOLN
INTRAMUSCULAR | Status: AC
  Filled 2019-09-05: qty 5

## 2019-09-05 NOTE — Op Note (Signed)
[facility] Gastroenterology Patient Name: Claudia Pittman Procedure Date: 09/05/2019 10:44 AM MRN: [id number] Account #: [id number] Date of Birth: May 21, 1950 Admit Type: Outpatient Age: 69 Room: [facility] ENDO ROOM 3 Gender: Female Note Status: Finalized Procedure:             Upper GI endoscopy Indications:           Portal venous hypertension, Cirrhosis Providers:             Benay Pike. Alice Reichert MD, MD Referring MD:          Tracie Harrier, MD (Referring MD) Medicines:             Propofol per Anesthesia Complications:         No immediate complications. Procedure:             Pre-Anesthesia Assessment:                        - The risks and benefits of the procedure and the                         sedation options and risks were discussed with the                         patient. All questions were answered and informed                         consent was obtained.                        - Patient identification and proposed procedure were                         verified prior to the procedure by the nurse. The                         procedure was verified in the procedure room.                        - ASA Grade Assessment: III - A patient with severe                         systemic disease.                        - After reviewing the risks and benefits, the patient                         was deemed in satisfactory condition to undergo the                         procedure.                        After obtaining informed consent, the endoscope was                         passed under direct vision. Throughout the procedure,                         the patient's blood pressure,  pulse, and oxygen                         saturations were monitored continuously. The Endoscope                         was introduced through the mouth, and advanced to the                         third part of duodenum. The upper GI endoscopy was                         accomplished  without difficulty. The patient tolerated                         the procedure well. Findings:      The examined esophagus was normal.      There is no endoscopic evidence of varices in the entire esophagus.      A single small angioectasia with no bleeding was found in the cardia.      Two small angioectasias without bleeding were found in the second       portion of the duodenum.      The first portion of the duodenum and third portion of the duodenum were       normal.      There is no endoscopic evidence of varices in the entire examined       stomach.      The exam was otherwise without abnormality. Impression:            - Normal esophagus.                        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                        - Two non-bleeding angioectasias in the duodenum.                        - Normal first portion of the duodenum and third                         portion of the duodenum.                        - The examination was otherwise normal.                        - No specimens collected. Recommendation:        - Patient has a contact number available for                         emergencies. The signs and symptoms of potential                         delayed complications were discussed with the patient.                         Return to normal activities tomorrow. Written  discharge instructions were provided to the patient.                        - Resume previous diet.                        - Continue present medications.                        - Repeat upper endoscopy in 3 years for screening                         purposes.                        - Return to nurse practitioner in 3 months.                        - Follow up with Stephens November, GI Nurse                         Practioner, in office to discuss results and monitor                         progress.                        - The findings and  recommendations were discussed with                         the patient. 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 All rights reserved. The codes documented in this report are preliminary and upon coder review may  be revised to meet current compliance requirements. Efrain Sella MD, MD 09/05/2019 11:12:00 AM This report has been signed electronically. Number of Addenda: 0 Note Initiated On: 09/05/2019 10:44 AM Estimated Blood Loss:  Estimated blood loss: none.      [facility]

## 2019-09-05 NOTE — Interval H&P Note (Signed)
History and Physical Interval Note:  09/05/2019 10:39 AM  Claudia Pittman  has presented today for surgery, with the diagnosis of CIRRHOSIS.  The various methods of treatment have been discussed with the patient and family. After consideration of risks, benefits and other options for treatment, the patient has consented to  Procedure(s): ESOPHAGOGASTRODUODENOSCOPY (EGD) (N/A) as a surgical intervention.  The patient's history has been reviewed, patient examined, no change in status, stable for surgery.  I have reviewed the patient's chart and labs.  Questions were answered to the patient's satisfaction.     [place], [place]

## 2019-09-05 NOTE — H&P (Signed)
Outpatient short stay form Pre-procedure 09/05/2019 10:38 AM Claudia Pittman K. Alice Reichert, M.D.  Primary Physician: Tracie Harrier, M.D.  Reason for visit: Portal hypertension, cirrhosis, esophageal variceal screening  History of present illness:  Patient with hx of cirrhosis, hepatitis s/p eradication with harvoni, MELD of 6 presents for variceal screening. Last EGD in 2019 was negative for esophageal or gastric varices. Patient denies intractable heartburn, dysphagia, hemetemesis, abdominal pain, nausea or vomiting.  ]   Current Facility-Administered Medications:  .  0.9 %  sodium chloride infusion, , Intravenous, Continuous, Allessandra Bernardi, Benay Pike, MD  Medications Prior to Admission  Medication Sig Dispense Refill Last Dose  . amLODipine (NORVASC) 5 MG tablet Take 5 mg by mouth daily.   09/05/2019 at Unknown time  . Cholecalciferol 1000 units capsule Take 1,000 Units by mouth daily.   Past Week at Unknown time  . Dorzolamide HCl-Timolol Mal PF 22.3-6.8 MG/ML SOLN Place 1 drop into both eyes 2 (two) times daily.   09/04/2019 at Unknown time  . LACTULOSE PO Take 30 mLs by mouth in the morning and at bedtime.   09/04/2019 at Unknown time  . latanoprost (XALATAN) 0.005 % ophthalmic solution Place 1 drop into both eyes at bedtime.   09/04/2019 at Unknown time  . alum & mag hydroxide-simeth (MAALOX/MYLANTA) 200-200-20 MG/5ML suspension Take 15 mLs by mouth 3 (three) times daily.     . Ledipasvir-Sofosbuvir (HARVONI) 90-400 MG TABS Take 1 tablet by mouth daily. (Patient not taking: Reported on 09/05/2019)   Not Taking at Unknown time  . sucralfate (CARAFATE) 1 GM/10ML suspension Take 1 g by mouth 2 (two) times daily. (Patient not taking: Reported on 09/05/2019)   Not Taking at Unknown time     No Known Allergies   Past Medical History:  Diagnosis Date  . Diverticulosis   . Hepatitis    hep c  . 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 Hypertension   . Tubular adenoma of colon     Review of  systems:  Otherwise negative.    Physical Exam  Gen: Alert, oriented. Appears stated age.  HEENT: [employer]/AT. PERRLA. Lungs: CTA, no wheezes. CV: RR nl S1, S2. Abd: soft, benign, no masses. BS+ Ext: No edema. Pulses 2+    Planned procedures: Proceed with EGD. The patient understands the nature of the planned procedure, indications, risks, alternatives and potential complications including but not limited to bleeding, infection, perforation, damage to internal organs and possible oversedation/side effects from anesthesia. The patient agrees and gives consent to proceed.  Please refer to procedure notes for findings, recommendations and patient disposition/instructions.     Aino Heckert K. Alice Reichert, M.D. Gastroenterology 09/05/2019  10:38 AM

## 2019-09-05 NOTE — Anesthesia Preprocedure Evaluation (Signed)
Anesthesia Evaluation  Patient identified by MRN, date of birth, ID band Patient awake    Reviewed: Allergy & Precautions, H&P , NPO status , Patient's Chart, lab work & pertinent test results, reviewed documented beta blocker date and time   History of Anesthesia Complications Negative for: history of anesthetic complications  Airway Mallampati: II  TM Distance: >3 FB Neck ROM: full    Dental  (+) Dental Advidsory Given, Edentulous Upper, Edentulous Lower   Pulmonary neg shortness of breath, neg COPD, neg recent URI, Current Smoker,           Cardiovascular Exercise Tolerance: Good hypertension, (-) angina(-) CAD, (-) Past MI, (-) Cardiac Stents and (-) CABG (-) dysrhythmias (-) Valvular Problems/Murmurs     Neuro/Psych negative neurological ROS  negative psych ROS   GI/Hepatic negative GI ROS, (+) Cirrhosis       , Hepatitis - (s/p treatment), C  Endo/Other  negative endocrine ROS  Renal/GU negative Renal ROS  negative genitourinary   Musculoskeletal   Abdominal   Peds  Hematology negative hematology ROS (+)   Anesthesia Other Findings Past Medical History: No date: Diverticulosis No date: Hepatitis     Comment:  hep c No date: Hypertension   Reproductive/Obstetrics negative OB ROS                             Anesthesia Physical  Anesthesia Plan  ASA: III  Anesthesia Plan: General   Post-op Pain Management:    Induction: Intravenous  PONV Risk Score and Plan: 2 and Propofol infusion and TIVA  Airway Management Planned: Natural Airway and Nasal Cannula  Additional Equipment:   Intra-op Plan:   Post-operative Plan:   Informed Consent: I have reviewed the patients History and Physical, chart, labs and discussed the procedure including the risks, benefits and alternatives for the proposed anesthesia with the patient or authorized representative who has indicated his/her  understanding and acceptance.     Dental Advisory Given  Plan Discussed with: Anesthesiologist, CRNA and Surgeon  Anesthesia Plan Comments:         Anesthesia Quick Evaluation

## 2019-09-05 NOTE — Transfer of Care (Signed)
Immediate Anesthesia Transfer of Care Note  Patient: Claudia Pittman  Procedure(s) Performed: ESOPHAGOGASTRODUODENOSCOPY (EGD) (N/A )  Patient Location: PACU and Endoscopy Unit  Anesthesia Type:General  Level of Consciousness: drowsy  Airway & Oxygen Therapy: Patient Spontanous Breathing  Post-op Assessment: Report given to RN  Post vital signs: stable  Last Vitals:  Vitals Value Taken Time  BP    Temp    Pulse    Resp    SpO2      Last Pain:  Vitals:   09/05/19 1004  TempSrc: Temporal  PainSc: 0-No pain         Complications: No complications documented.

## 2019-09-06 ENCOUNTER — Ambulatory Visit
Admission: RE | Admit: 2019-09-06 | Discharge: 2019-09-06 | Disposition: A | Discharge status: Home / Self Care | Payer: Medicare HMO | Source: Ambulatory Visit | Attending: Internal Medicine | Admitting: Internal Medicine

## 2019-09-06 DIAGNOSIS — R928 Other abnormal and inconclusive findings on diagnostic imaging of breast: Secondary | ICD-10-CM | POA: Diagnosis present

## 2019-09-06 DIAGNOSIS — N6310 Unspecified lump in the right breast, unspecified quadrant: Secondary | ICD-10-CM | POA: Diagnosis present

## 2019-09-06 HISTORY — PX: BREAST BIOPSY: SHX20

## 2019-09-06 NOTE — Anesthesia Postprocedure Evaluation (Signed)
Anesthesia Post Note  Patient: Claudia Pittman  Procedure(s) Performed: ESOPHAGOGASTRODUODENOSCOPY (EGD) (N/A )  Patient location during evaluation: Endoscopy Anesthesia Type: General Level of consciousness: awake and alert and oriented Pain management: pain level controlled Vital Signs Assessment: post-procedure vital signs reviewed and stable Respiratory status: spontaneous breathing Cardiovascular status: blood pressure returned to baseline Anesthetic complications: no   No complications documented.   Last Vitals:  Vitals:   09/05/19 1130 09/05/19 1138  BP: (!) 145/74 (!) 144/99  Pulse: 63 (!) 55  Resp: 14 13  Temp:    SpO2: 100% 100%    Last Pain:  Vitals:   09/05/19 1138  TempSrc:   PainSc: 0-No pain                 Kartik Fernando

## 2019-09-09 LAB — SURGICAL PATHOLOGY

## 2020-07-02 ENCOUNTER — Other Ambulatory Visit: Payer: Self-pay | Admitting: Internal Medicine

## 2020-07-02 DIAGNOSIS — Z1231 Encounter for screening mammogram for malignant neoplasm of breast: Secondary | ICD-10-CM

## 2020-08-25 ENCOUNTER — Other Ambulatory Visit: Payer: Self-pay | Admitting: Gastroenterology

## 2020-08-25 DIAGNOSIS — K7460 Unspecified cirrhosis of liver: Secondary | ICD-10-CM

## 2020-09-17 ENCOUNTER — Ambulatory Visit
Admission: RE | Admit: 2020-09-17 | Discharge: 2020-09-17 | Disposition: A | Discharge status: Home / Self Care | Payer: Medicare HMO | Source: Ambulatory Visit | Attending: Internal Medicine | Admitting: Internal Medicine

## 2020-09-17 ENCOUNTER — Ambulatory Visit
Admission: RE | Admit: 2020-09-17 | Discharge: 2020-09-17 | Disposition: A | Discharge status: Home / Self Care | Payer: Medicare HMO | Source: Ambulatory Visit | Attending: Gastroenterology | Admitting: Gastroenterology

## 2020-09-17 ENCOUNTER — Other Ambulatory Visit: Payer: Self-pay

## 2020-09-17 DIAGNOSIS — Z1231 Encounter for screening mammogram for malignant neoplasm of breast: Secondary | ICD-10-CM | POA: Insufficient documentation

## 2020-09-17 DIAGNOSIS — K7460 Unspecified cirrhosis of liver: Secondary | ICD-10-CM

## 2021-01-30 IMAGING — MG DIGITAL SCREENING BILAT W/ TOMO W/ CAD
8 series · 8 of 24 positions shown · non-contrast
Comparison: Previous exam(s).

CLINICAL DATA: Screening.

EXAM:
DIGITAL SCREENING BILATERAL MAMMOGRAM WITH TOMO AND CAD

[L MLO synth-2D]
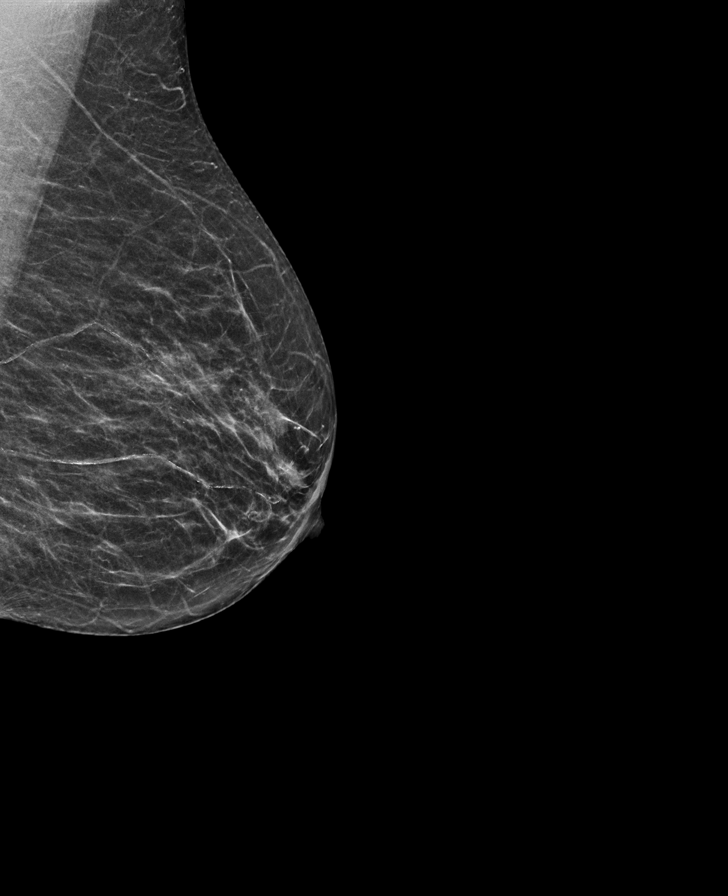

[R CC synth-2D]
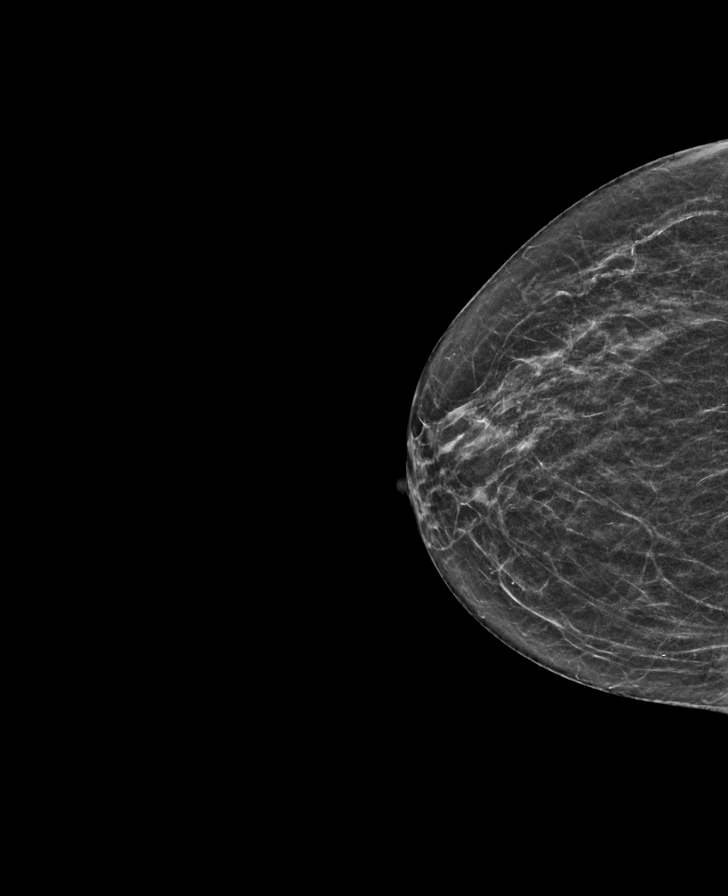

[L CC synth-2D]
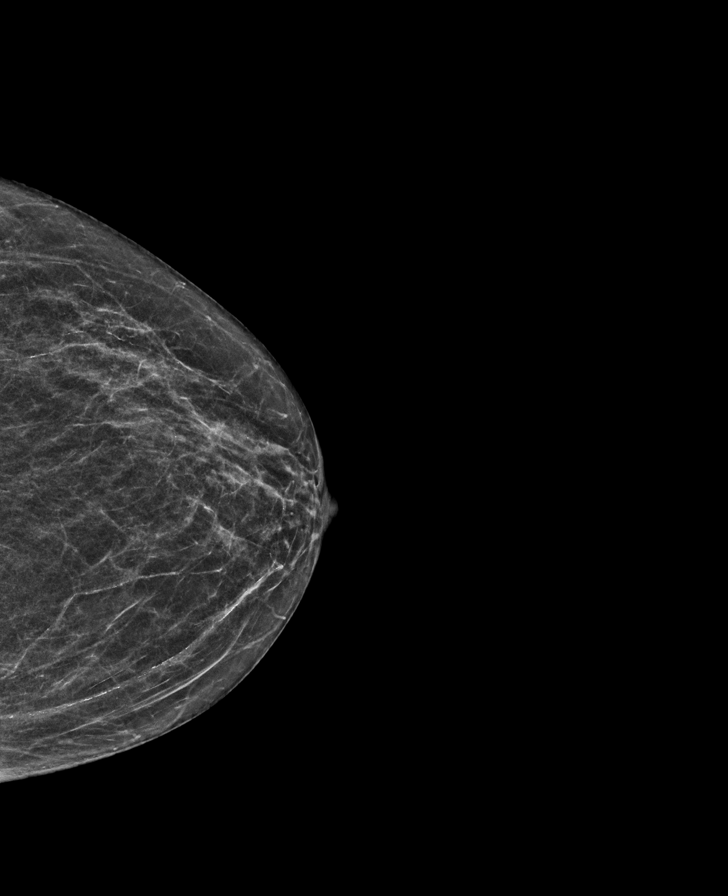

[R MLO synth-2D]
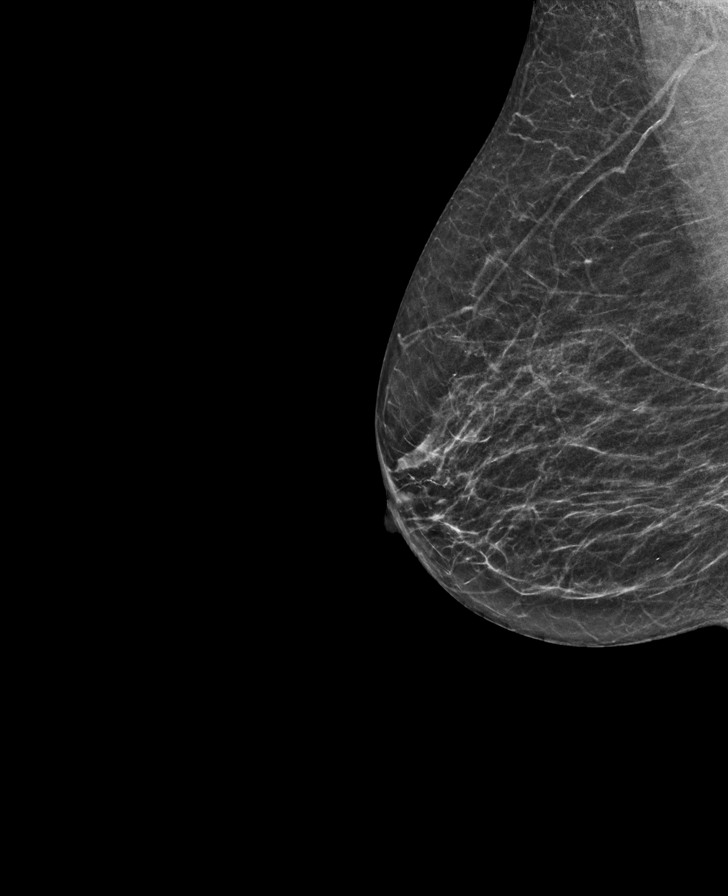

[L CC tomo · tomo slice 23/46.0]
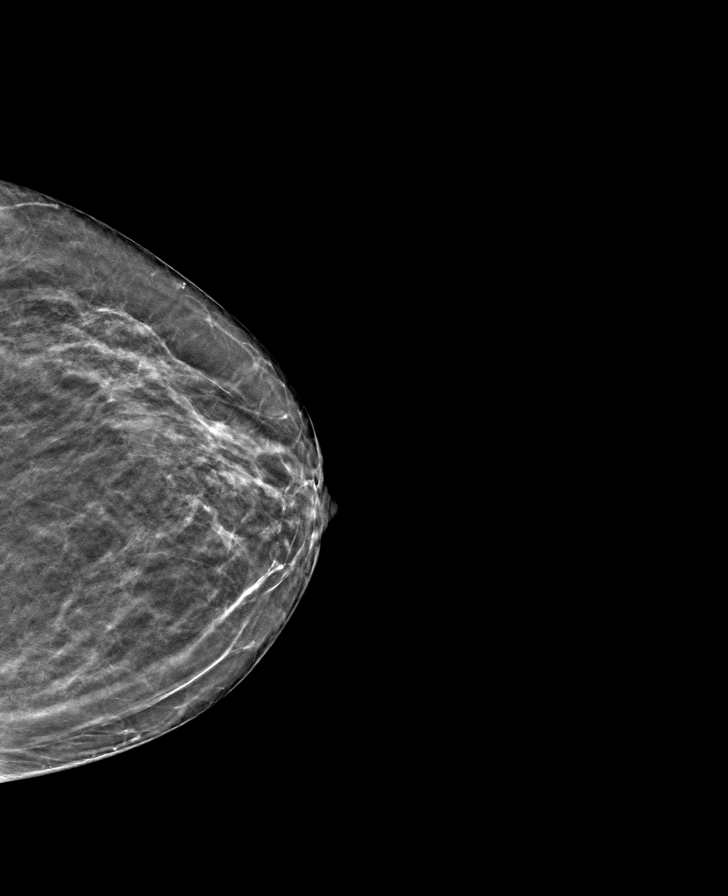

[R CC tomo · tomo slice 24/47.0]
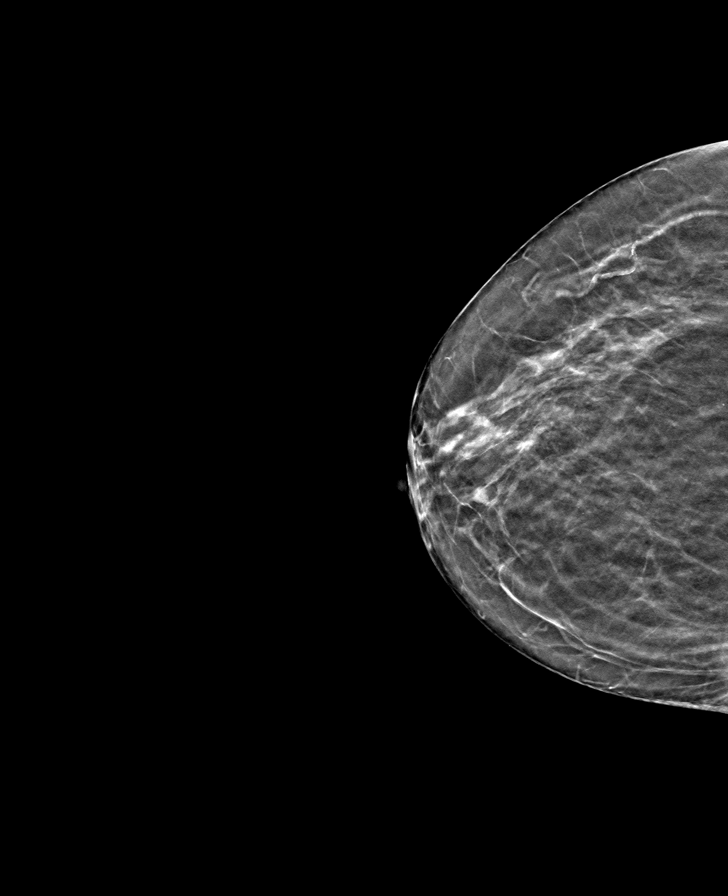

[R MLO tomo · tomo slice 24/47.0]
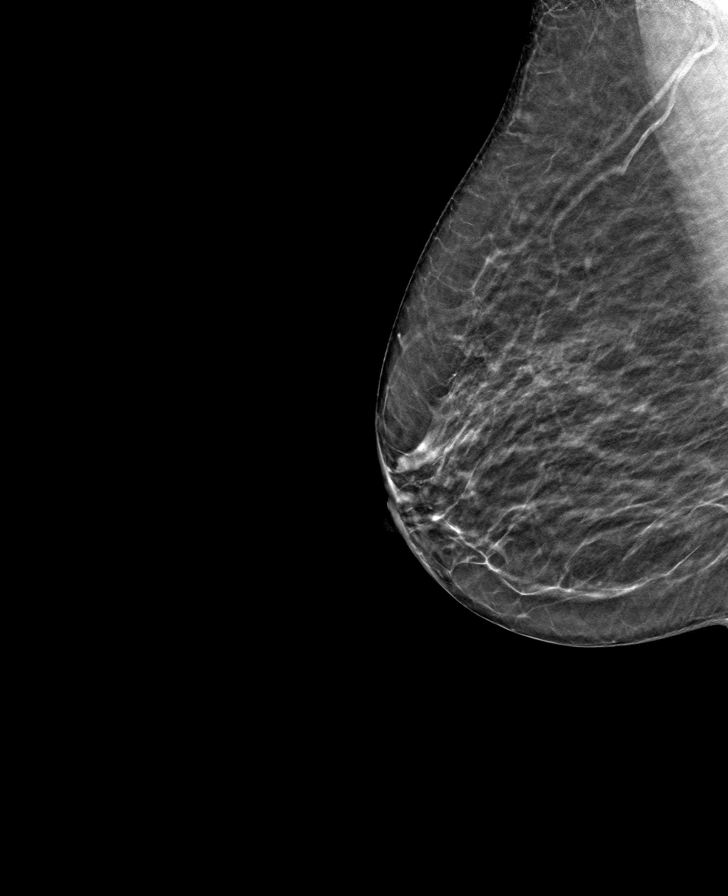

[L MLO tomo · tomo slice 25/50.0]
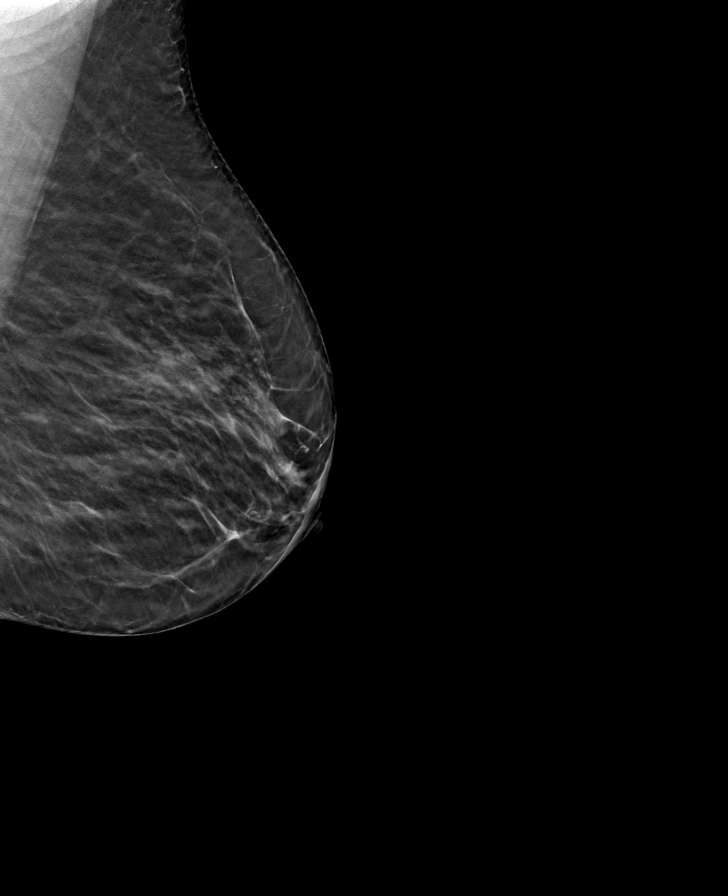

[8 of 24 positions shown; findings below may reference images not displayed]

ACR Breast Density Category b: There are scattered areas of
fibroglandular density.
FINDINGS: In the right breast, a possible asymmetry warrants further
evaluation. In the left breast, no findings suspicious for
malignancy. Images were processed with CAD.
IMPRESSION: Further evaluation is suggested for possible asymmetry in the right
breast.

RECOMMENDATION:
Diagnostic mammogram and possibly ultrasound of the right breast.
(Code:[id number]-55T)

The patient will be contacted regarding the findings, and additional
imaging will be scheduled.

BI-RADS CATEGORY  0: Incomplete. Need additional imaging evaluation
and/or prior mammograms for comparison.

## 2021-03-03 IMAGING — MG MM BREAST LOCALIZATION CLIP
4 series · 4 of 12 positions shown · non-contrast
Comparison: Previous exam(s).

CLINICAL DATA: Status post ultrasound-guided core needle biopsy of
an 8 mm mass in the 12 o'clock position of the right breast.

EXAM:
DIAGNOSTIC RIGHT MAMMOGRAM POST ULTRASOUND BIOPSY

[R ML synth-2D]
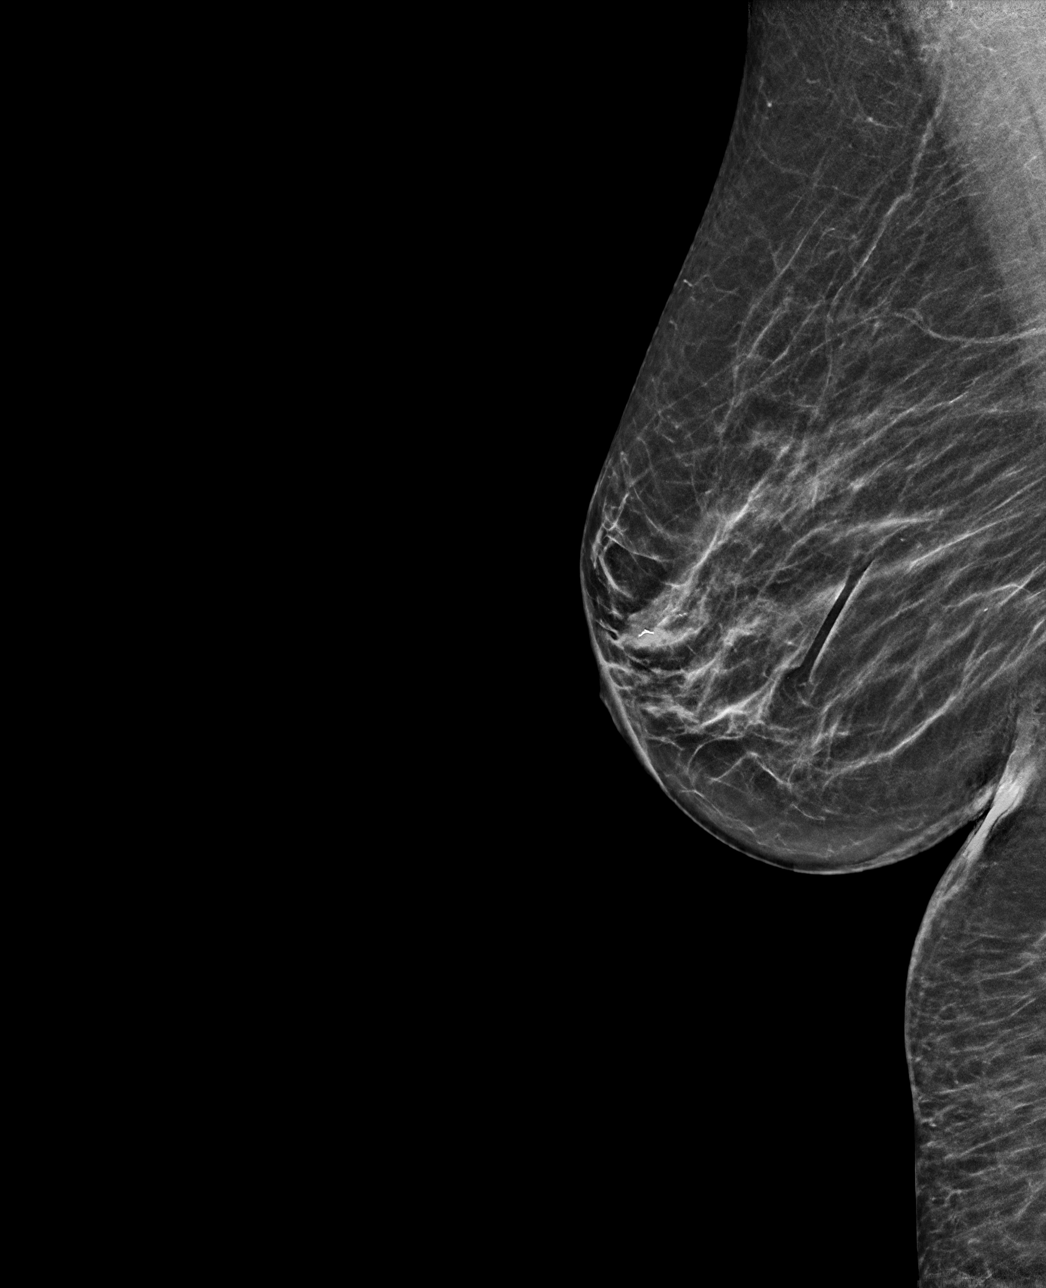

[R CC synth-2D]
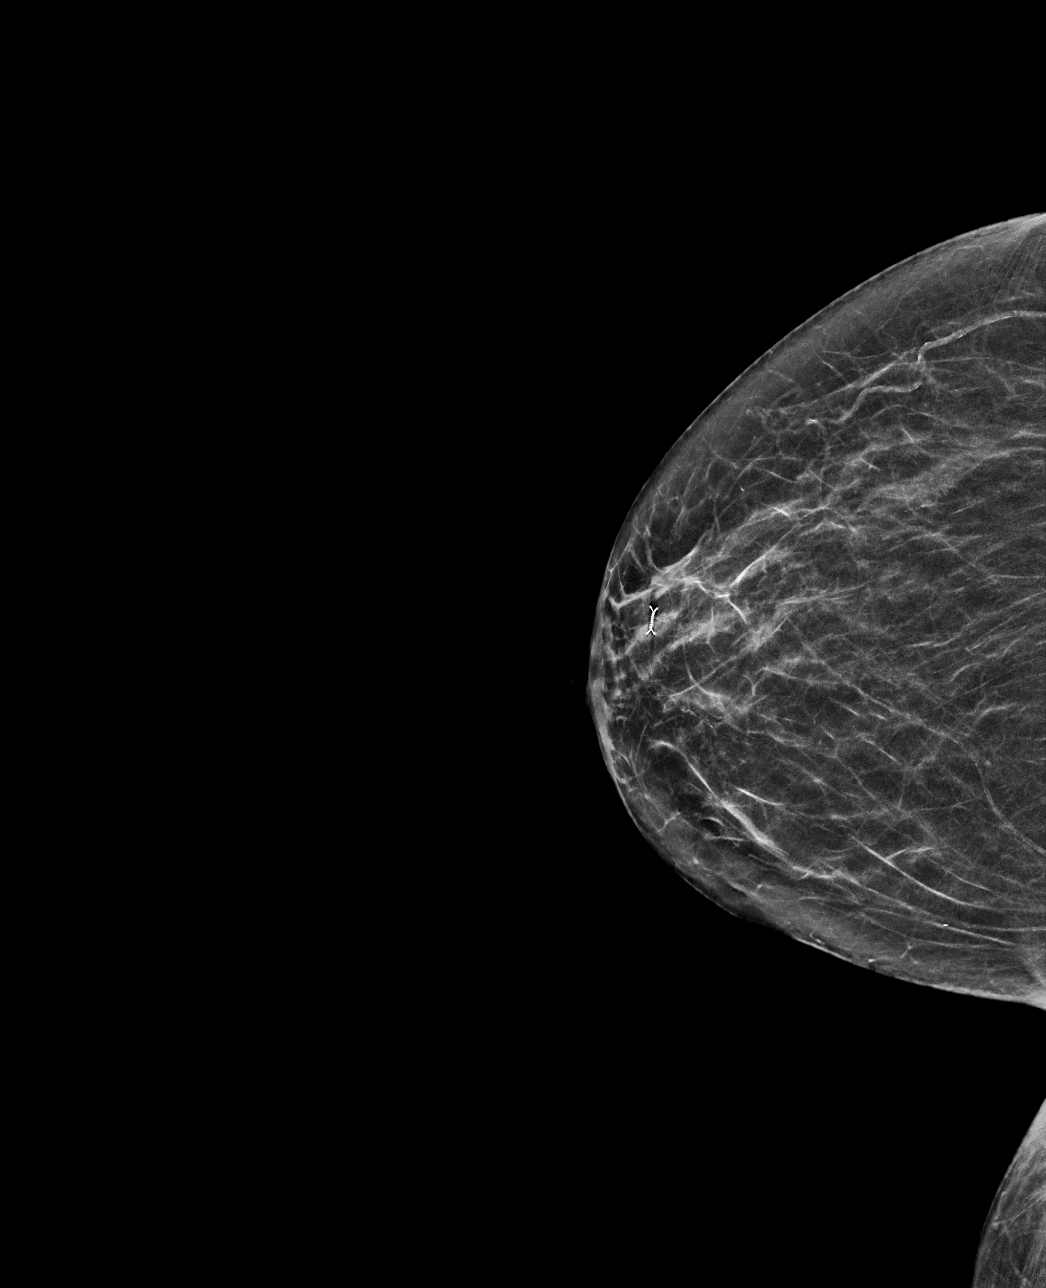

[R ML tomo · tomo slice 27/53.0]
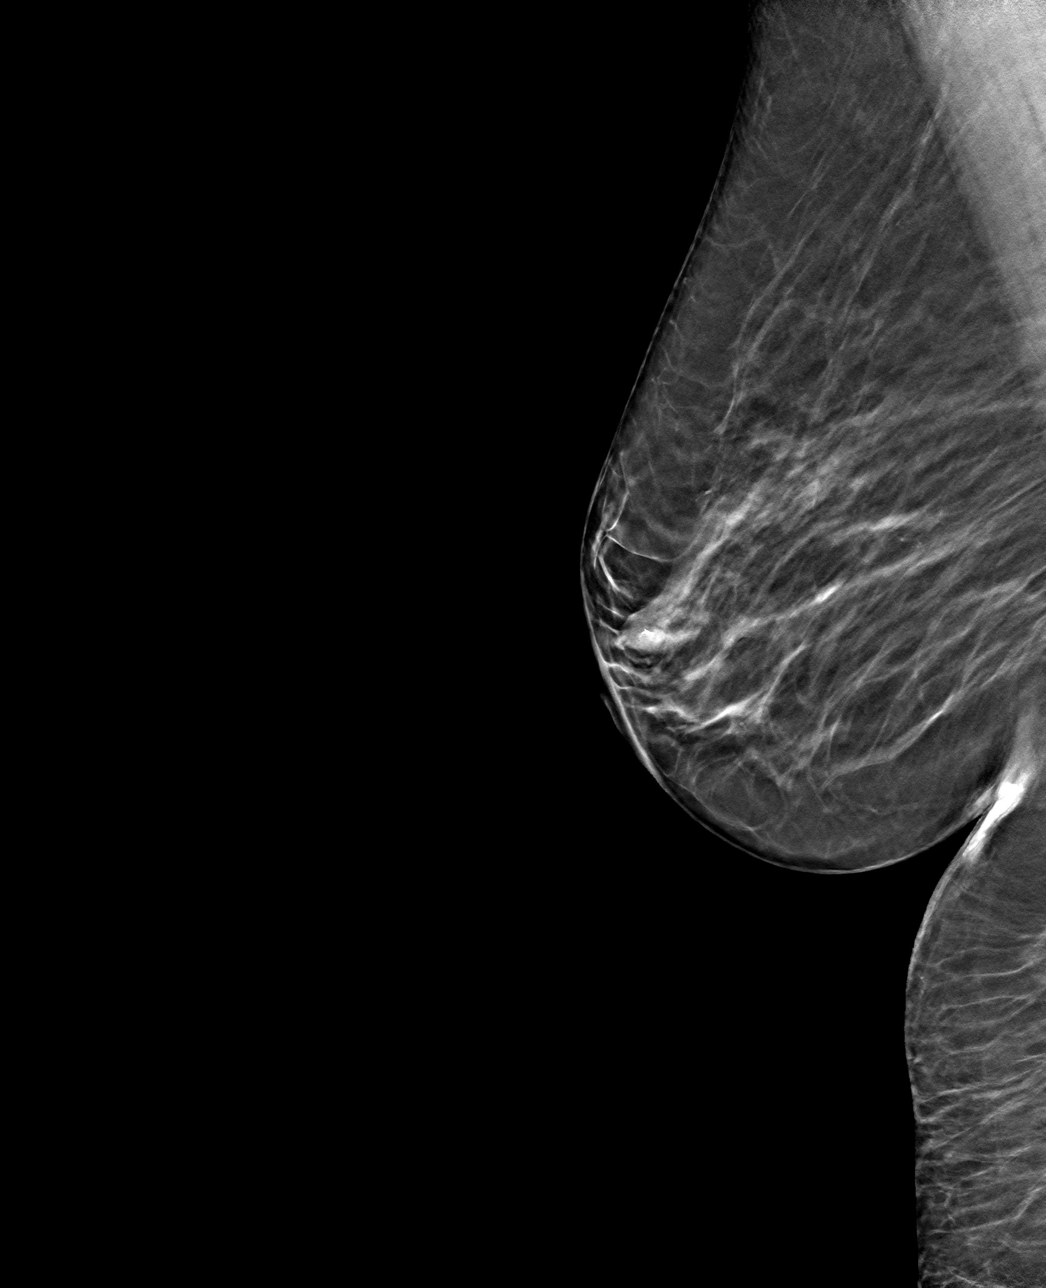

[R CC tomo · tomo slice 27/53.0]
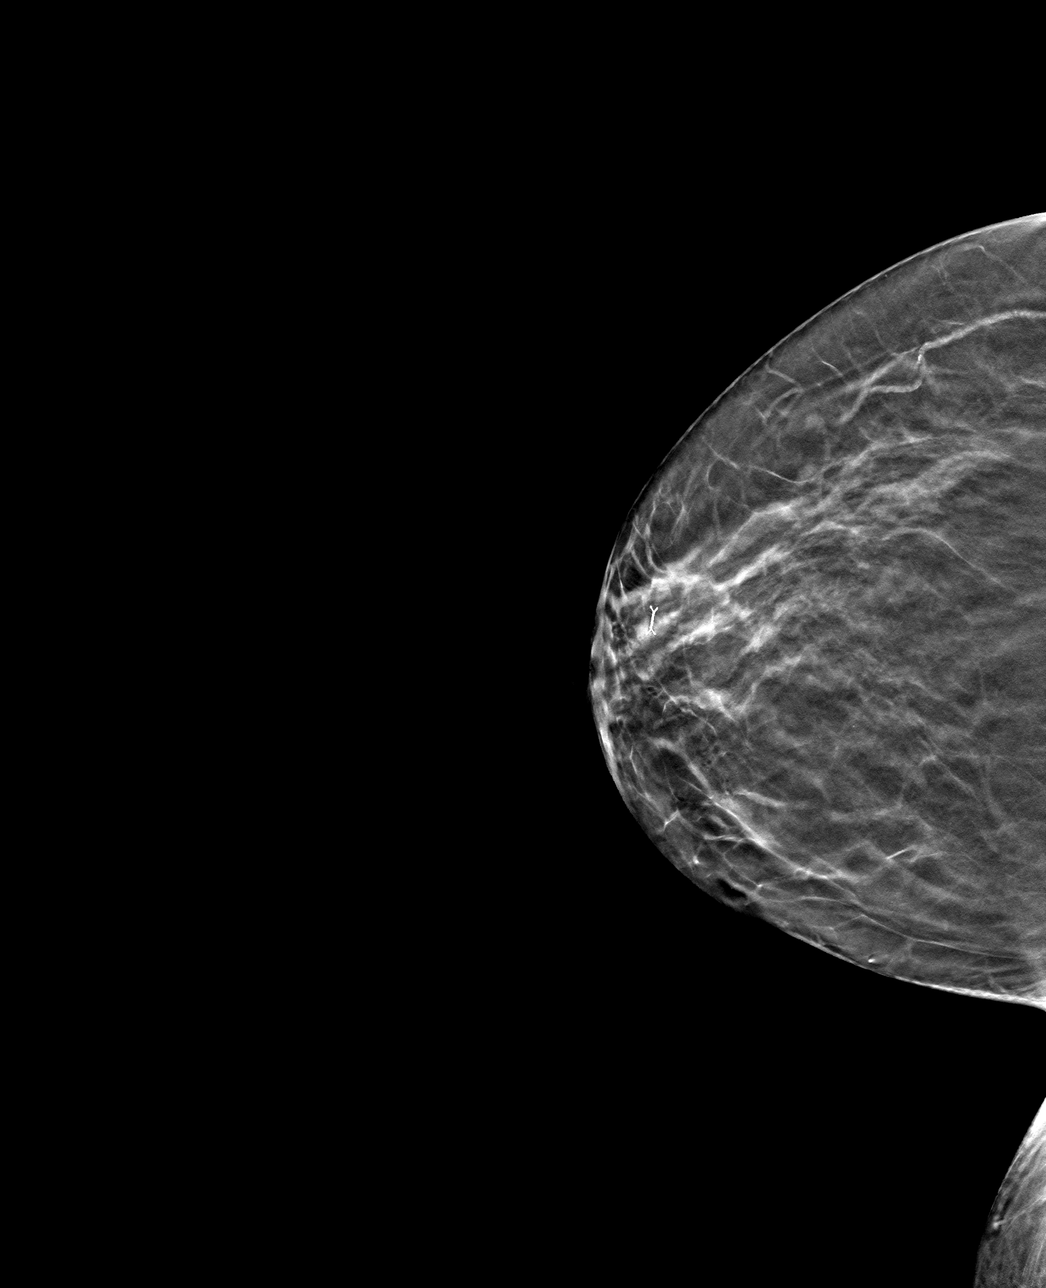

[4 of 12 positions shown; findings below may reference images not displayed]

FINDINGS: Mammographic images were obtained following ultrasound guided biopsy
of the recently demonstrated 8 mm mass in the 12 o'clock position of
the right breast. The biopsy marking clip is in expected position at
the site of biopsy. This is not at the location of the small rounded
asymmetry seen slightly more medially and posteriorly in the breast
the recent mammograms. Normal appearing fibroglandular tissue with
interspersed fat is demonstrated at that location currently.
IMPRESSION: Appropriate positioning of the X shaped biopsy marking clip at the
site of biopsy in the upper retroareolar right breast.

Final Assessment: Post Procedure Mammograms for Marker Placement

## 2021-03-30 ENCOUNTER — Other Ambulatory Visit: Payer: Self-pay | Admitting: *Deleted

## 2021-03-30 DIAGNOSIS — Z87891 Personal history of nicotine dependence: Secondary | ICD-10-CM

## 2021-03-30 DIAGNOSIS — F17210 Nicotine dependence, cigarettes, uncomplicated: Secondary | ICD-10-CM

## 2021-04-21 ENCOUNTER — Ambulatory Visit
Admission: RE | Admit: 2021-04-21 | Discharge: 2021-04-21 | Disposition: A | Discharge status: Home / Self Care | Payer: Medicare HMO | Source: Ambulatory Visit | Attending: Acute Care | Admitting: Acute Care

## 2021-04-21 DIAGNOSIS — Z87891 Personal history of nicotine dependence: Secondary | ICD-10-CM | POA: Diagnosis present

## 2021-04-21 DIAGNOSIS — F17210 Nicotine dependence, cigarettes, uncomplicated: Secondary | ICD-10-CM | POA: Insufficient documentation

## 2021-04-23 ENCOUNTER — Other Ambulatory Visit: Payer: Self-pay | Admitting: Acute Care

## 2021-04-23 DIAGNOSIS — Z122 Encounter for screening for malignant neoplasm of respiratory organs: Secondary | ICD-10-CM

## 2021-04-23 DIAGNOSIS — F17210 Nicotine dependence, cigarettes, uncomplicated: Secondary | ICD-10-CM

## 2021-04-23 DIAGNOSIS — Z87891 Personal history of nicotine dependence: Secondary | ICD-10-CM

## 2021-08-12 ENCOUNTER — Other Ambulatory Visit: Payer: Self-pay | Admitting: Internal Medicine

## 2021-08-12 DIAGNOSIS — Z1231 Encounter for screening mammogram for malignant neoplasm of breast: Secondary | ICD-10-CM

## 2021-09-22 ENCOUNTER — Ambulatory Visit
Admission: RE | Admit: 2021-09-22 | Discharge: 2021-09-22 | Disposition: A | Discharge status: Home / Self Care | Payer: Medicare HMO | Source: Ambulatory Visit | Attending: Internal Medicine | Admitting: Internal Medicine

## 2021-09-22 DIAGNOSIS — Z1231 Encounter for screening mammogram for malignant neoplasm of breast: Secondary | ICD-10-CM | POA: Insufficient documentation

## 2021-11-09 ENCOUNTER — Other Ambulatory Visit: Payer: Self-pay | Admitting: Gastroenterology

## 2021-11-09 DIAGNOSIS — K7460 Unspecified cirrhosis of liver: Secondary | ICD-10-CM

## 2021-11-19 ENCOUNTER — Ambulatory Visit
Admission: RE | Admit: 2021-11-19 | Discharge: 2021-11-19 | Disposition: A | Discharge status: Home / Self Care | Payer: Medicare HMO | Source: Ambulatory Visit | Attending: Gastroenterology | Admitting: Gastroenterology

## 2021-11-19 DIAGNOSIS — K7460 Unspecified cirrhosis of liver: Secondary | ICD-10-CM | POA: Insufficient documentation

## 2022-02-11 ENCOUNTER — Encounter: Payer: Self-pay | Admitting: *Deleted

## 2022-02-11 ENCOUNTER — Encounter
Admission: RE | Disposition: A | Discharge status: Home / Self Care | Payer: Self-pay | Source: Home / Self Care | Attending: Gastroenterology

## 2022-02-11 ENCOUNTER — Other Ambulatory Visit: Payer: Self-pay

## 2022-02-11 ENCOUNTER — Ambulatory Visit: Payer: Medicare HMO | Admitting: Certified Registered"

## 2022-02-11 ENCOUNTER — Ambulatory Visit
Admission: RE | Admit: 2022-02-11 | Discharge: 2022-02-11 | Disposition: A | Discharge status: Home / Self Care | Payer: Medicare HMO | Attending: Gastroenterology | Admitting: Gastroenterology

## 2022-02-11 DIAGNOSIS — D509 Iron deficiency anemia, unspecified: Secondary | ICD-10-CM | POA: Diagnosis not present

## 2022-02-11 DIAGNOSIS — K31819 Angiodysplasia of stomach and duodenum without bleeding: Secondary | ICD-10-CM | POA: Insufficient documentation

## 2022-02-11 DIAGNOSIS — I10 Essential (primary) hypertension: Secondary | ICD-10-CM | POA: Insufficient documentation

## 2022-02-11 DIAGNOSIS — K7460 Unspecified cirrhosis of liver: Secondary | ICD-10-CM | POA: Diagnosis not present

## 2022-02-11 DIAGNOSIS — Z86010 Personal history of colonic polyps: Secondary | ICD-10-CM | POA: Insufficient documentation

## 2022-02-11 DIAGNOSIS — F17200 Nicotine dependence, unspecified, uncomplicated: Secondary | ICD-10-CM | POA: Insufficient documentation

## 2022-02-11 DIAGNOSIS — Z8619 Personal history of other infectious and parasitic diseases: Secondary | ICD-10-CM | POA: Diagnosis not present

## 2022-02-11 HISTORY — PX: COLONOSCOPY WITH PROPOFOL: SHX5780

## 2022-02-11 HISTORY — PX: ESOPHAGOGASTRODUODENOSCOPY (EGD) WITH PROPOFOL: SHX5813

## 2022-02-11 SURGERY — COLONOSCOPY WITH PROPOFOL
Anesthesia: General

## 2022-02-11 MED ORDER — SODIUM CHLORIDE 0.9 % IV SOLN: INTRAVENOUS | Status: DC

## 2022-02-11 MED ORDER — LIDOCAINE HCL (PF) 2 % IJ SOLN
INTRAMUSCULAR | Status: AC
Start: 2022-02-11 — End: ?
  Filled 2022-02-11: qty 5

## 2022-02-11 MED ORDER — PROPOFOL 1000 MG/100ML IV EMUL
INTRAVENOUS | Status: AC
  Filled 2022-02-11: qty 300

## 2022-02-11 MED ORDER — PROPOFOL 500 MG/50ML IV EMUL
INTRAVENOUS | Status: DC | PRN
  Administered 2022-02-11: 120 ug/kg/min via INTRAVENOUS

## 2022-02-11 MED ORDER — LIDOCAINE 2% (20 MG/ML) 5 ML SYRINGE
INTRAMUSCULAR | Status: DC | PRN
  Administered 2022-02-11: 20 mg via INTRAVENOUS

## 2022-02-11 MED ORDER — GLYCOPYRROLATE 0.2 MG/ML IJ SOLN
INTRAMUSCULAR | Status: AC
  Filled 2022-02-11: qty 1

## 2022-02-11 MED ORDER — PROPOFOL 10 MG/ML IV BOLUS
INTRAVENOUS | Status: DC | PRN
  Administered 2022-02-11: 70 mg via INTRAVENOUS

## 2022-02-11 MED ORDER — GLYCOPYRROLATE 0.2 MG/ML IJ SOLN
INTRAMUSCULAR | Status: DC | PRN
  Administered 2022-02-11: .2 mg via INTRAVENOUS

## 2022-02-11 NOTE — Transfer of Care (Signed)
Immediate Anesthesia Transfer of Care Note  Patient: Claudia Pittman  Procedure(s) Performed: COLONOSCOPY WITH PROPOFOL ESOPHAGOGASTRODUODENOSCOPY (EGD) WITH PROPOFOL  Patient Location: Endoscopy Unit  Anesthesia Type:General  Level of Consciousness: drowsy  Airway & Oxygen Therapy: Patient Spontanous Breathing  Post-op Assessment: Report given to RN and Post -op Vital signs reviewed and stable  Post vital signs: Reviewed  Last Vitals:  Vitals Value Taken Time  BP 103/50 02/11/22 0818  Temp    Pulse 70 02/11/22 0818  Resp 18 02/11/22 0818  SpO2 100 % 02/11/22 0818    Last Pain:  Vitals:   02/11/22 0654  TempSrc: Temporal  PainSc: 0-No pain         Complications: No notable events documented.

## 2022-02-11 NOTE — Anesthesia Preprocedure Evaluation (Signed)
Anesthesia Evaluation  Patient identified by MRN, date of birth, ID band Patient awake    Reviewed: Allergy & Precautions, H&P , NPO status , Patient's Chart, lab work & pertinent test results, reviewed documented beta blocker date and time   History of Anesthesia Complications Negative for: history of anesthetic complications  Airway Mallampati: II  TM Distance: >3 FB Neck ROM: full    Dental  (+) Dental Advidsory Given, Edentulous Upper, Edentulous Lower   Pulmonary neg shortness of breath, neg COPD, neg recent URI, Current Smoker and Patient abstained from smoking.          Cardiovascular Exercise Tolerance: Good hypertension, (-) angina (-) CAD, (-) Past MI, (-) Cardiac Stents and (-) CABG (-) dysrhythmias (-) Valvular Problems/Murmurs     Neuro/Psych negative neurological ROS  negative psych ROS   GI/Hepatic negative GI ROS,,,(+) Cirrhosis       , Hepatitis - (s/p treatment), C  Endo/Other  negative endocrine ROS    Renal/GU negative Renal ROS  negative genitourinary   Musculoskeletal   Abdominal   Peds  Hematology negative hematology ROS (+)   Anesthesia Other Findings Past Medical History: No date: Diverticulosis No date: Hepatitis     Comment:  hep c No date: Hypertension   Reproductive/Obstetrics negative OB ROS                             Anesthesia Physical Anesthesia Plan  ASA: 3  Anesthesia Plan: General   Post-op Pain Management:    Induction: Intravenous  PONV Risk Score and Plan: 2 and Propofol infusion and TIVA  Airway Management Planned: Natural Airway and Nasal Cannula  Additional Equipment:   Intra-op Plan:   Post-operative Plan:   Informed Consent: I have reviewed the patients History and Physical, chart, labs and discussed the procedure including the risks, benefits and alternatives for the proposed anesthesia with the patient or authorized  representative who has indicated his/her understanding and acceptance.     Dental Advisory Given  Plan Discussed with: Anesthesiologist, CRNA and Surgeon  Anesthesia Plan Comments:         Anesthesia Quick Evaluation

## 2022-02-11 NOTE — Interval H&P Note (Signed)
History and Physical Interval Note:  02/11/2022 7:53 AM  Claudia Pittman  has presented today for surgery, with the diagnosis of HX of adenomatous polyp of colon ,IDA,cirrhosis of liver.  The various methods of treatment have been discussed with the patient and family. After consideration of risks, benefits and other options for treatment, the patient has consented to  Procedure(s): COLONOSCOPY WITH PROPOFOL (N/A) ESOPHAGOGASTRODUODENOSCOPY (EGD) WITH PROPOFOL (N/A) as a surgical intervention.  The patient's history has been reviewed, patient examined, no change in status, stable for surgery.  I have reviewed the patient's chart and labs.  Questions were answered to the patient's satisfaction.     Lesly Rubenstein  Ok to proceed with EGD/Colonoscopy

## 2022-02-11 NOTE — H&P (Signed)
Outpatient short stay form Pre-procedure 02/11/2022  Lesly Rubenstein, MD  Primary Physician: Tracie Harrier, MD  Reason for visit:  IDA  History of present illness:    72 y/o lady with history of hepatitis C cirrhosis with SVR and hypertension here for EGD/Colonoscopy for IDA. Last colonoscopy in 2019 with TA. No blood thinners. No family history of GI malignancies. Denies any abdominal surgeries.    Current Facility-Administered Medications:    0.9 %  sodium chloride infusion, , Intravenous, Continuous, Kohen Reither, Hilton Cork, MD, Last Rate: 20 mL/hr at 02/11/22 0720, New Bag at 02/11/22 0720  Medications Prior to Admission  Medication Sig Dispense Refill Last Dose   amLODipine (NORVASC) 5 MG tablet Take 5 mg by mouth daily.   02/11/2022 at 0500   Cholecalciferol 1000 units capsule Take 1,000 Units by mouth daily.   02/10/2022   Dorzolamide HCl-Timolol Mal PF 22.3-6.8 MG/ML SOLN Place 1 drop into both eyes 2 (two) times daily.   02/10/2022   LACTULOSE PO Take 30 mLs by mouth in the morning and at bedtime.   02/10/2022   latanoprost (XALATAN) 0.005 % ophthalmic solution Place 1 drop into both eyes at bedtime.   02/10/2022   alum & mag hydroxide-simeth (MAALOX/MYLANTA) 200-200-20 MG/5ML suspension Take 15 mLs by mouth 3 (three) times daily.      Ledipasvir-Sofosbuvir (HARVONI) 90-400 MG TABS Take 1 tablet by mouth daily. (Patient not taking: Reported on 09/05/2019)      sucralfate (CARAFATE) 1 GM/10ML suspension Take 1 g by mouth 2 (two) times daily. 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    Physical Exam  Gen: Alert, oriented. Appears stated age.  HEENT: PERRLA. Lungs: No respiratory distress CV: RRR Abd: soft, benign, no masses Ext: No  edema    Planned procedures: Proceed with EGD/colonoscopy. The patient understands the nature of the planned procedure, indications, risks, alternatives and potential complications including but not limited to bleeding, infection, perforation, damage to internal organs and possible oversedation/side effects from anesthesia. The patient agrees and gives consent to proceed.  Please refer to procedure notes for findings, recommendations and patient disposition/instructions.     Lesly Rubenstein, MD [facility] Gastroenterology

## 2022-02-11 NOTE — Op Note (Signed)
[facility] Gastroenterology Patient Name: Claudia Pittman Procedure Date: 02/11/2022 7:23 AM MRN: [id number] Account #: [id number] Date of Birth: 1950/01/17 Admit Type: Outpatient Age: 72 Room: [facility] ENDO ROOM 3 Gender: Female Note Status: Supervisor Override Instrument Name: Jasper Riling [id number] Procedure:             Colonoscopy Indications:           Iron deficiency anemia, Personal history of colonic                         polyps Providers:             Andrey Farmer MD, MD Referring MD:          Tracie Harrier, MD (Referring MD) Medicines:             Monitored Anesthesia Care Complications:         No immediate complications. Procedure:             Pre-Anesthesia Assessment:                        - Prior to the procedure, a History and Physical was                         performed, and patient medications and allergies were                         reviewed. The patient is competent. The risks and                         benefits of the procedure and the sedation options and                         risks were discussed with the patient. All questions                         were answered and informed consent was obtained.                         Patient identification and proposed procedure were                         verified by the physician, the nurse, the                         anesthesiologist, the anesthetist and the technician                         in the endoscopy suite. Mental Status Examination:                         alert and oriented. Airway Examination: normal                         oropharyngeal airway and neck mobility. Respiratory                         Examination: clear to auscultation. CV Examination:  normal. Prophylactic Antibiotics: The patient does not                         require prophylactic antibiotics. Prior                         Anticoagulants: The patient has taken no anticoagulant                          or antiplatelet agents. ASA Grade Assessment: III - A                         patient with severe systemic disease. After reviewing                         the risks and benefits, the patient was deemed in                         satisfactory condition to undergo the procedure. The                         anesthesia plan was to use monitored anesthesia care                         (MAC). Immediately prior to administration of                         medications, the patient was re-assessed for adequacy                         to receive sedatives. The heart rate, respiratory                         rate, oxygen saturations, blood pressure, adequacy of                         pulmonary ventilation, and response to care were                         monitored throughout the procedure. The physical                         status of the patient was re-assessed after the                         procedure.                        After obtaining informed consent, the colonoscope was                         passed under direct vision. Throughout the procedure,                         the patient's blood pressure, pulse, and oxygen                         saturations were monitored continuously. The  Colonoscope was introduced through the anus and                         advanced to the the cecum, identified by the ileocecal                         valve. The colonoscopy was performed without                         difficulty. The patient tolerated the procedure well.                         The quality of the bowel preparation was good except                         the cecum was unsatisfactory. The ileocecal valve and                         the rectum were photographed. Findings:      The perianal and digital rectal examinations were normal.      Solid stool (vegetable material) was found in the cecum, precluding       visualization.      A tattoo was seen  in the distal ascending colon. The tattoo site       appeared normal.      The exam was otherwise without abnormality on direct and retroflexion       views. Impression:            - Stool in the cecum.                        - A tattoo was seen in the distal ascending colon. The                         tattoo site appeared normal.                        - The examination was otherwise normal on direct and                         retroflexion views.                        - No specimens collected. Recommendation:        - Discharge patient to home.                        - Resume previous diet.                        - Continue present medications.                        - Repeat colonoscopy in 6 months because the bowel                         preparation was suboptimal.                        - Return to referring physician as previously  scheduled. Procedure Code(s):     --- Professional ---                        [phone], Colonoscopy, flexible; diagnostic, including                         collection of specimen(s) by brushing or washing, when                         performed (separate procedure) Diagnosis Code(s):     --- Professional ---                        D50.9, Iron deficiency anemia, unspecified CPT copyright 2022 American Medical Association. All rights reserved. The codes documented in this report are preliminary and upon coder review may  be revised to meet current compliance requirements. Andrey Farmer MD, MD 02/11/2022 8:24:23 AM Number of Addenda: 0 Note Initiated On: 02/11/2022 7:23 AM Scope Withdrawal Time: 0 hours 4 minutes 30 seconds  Total Procedure Duration: 0 hours 6 minutes 28 seconds  Estimated Blood Loss:  Estimated blood loss: none.      [facility]

## 2022-02-11 NOTE — Op Note (Signed)
[facility] Gastroenterology Patient Name: Claudia Pittman Procedure Date: 02/11/2022 7:24 AM MRN: [id number] Account #: [id number] Date of Birth: 1950-08-20 Admit Type: Outpatient Age: 72 Room: [facility] ENDO ROOM 3 Gender: Female Note Status: Finalized Instrument Name: Upper Endoscope [id number] Procedure:             Upper GI endoscopy Indications:           Iron deficiency anemia Providers:             Andrey Farmer MD, MD Referring MD:          Tracie Harrier, MD (Referring MD) Medicines:             Monitored Anesthesia Care Complications:         No immediate complications. Procedure:             Pre-Anesthesia Assessment:                        - Prior to the procedure, a History and Physical was                         performed, and patient medications and allergies were                         reviewed. The patient is competent. The risks and                         benefits of the procedure and the sedation options and                         risks were discussed with the patient. All questions                         were answered and informed consent was obtained.                         Patient identification and proposed procedure were                         verified by the physician, the nurse, the                         anesthesiologist, the anesthetist and the technician                         in the endoscopy suite. Mental Status Examination:                         alert and oriented. Airway Examination: normal                         oropharyngeal airway and neck mobility. Respiratory                         Examination: clear to auscultation. CV Examination:                         normal. Prophylactic Antibiotics: The patient does not  require prophylactic antibiotics. Prior                         Anticoagulants: The patient has taken no anticoagulant                         or antiplatelet agents. ASA Grade  Assessment: III - A                         patient with severe systemic disease. After reviewing                         the risks and benefits, the patient was deemed in                         satisfactory condition to undergo the procedure. The                         anesthesia plan was to use monitored anesthesia care                         (MAC). Immediately prior to administration of                         medications, the patient was re-assessed for adequacy                         to receive sedatives. The heart rate, respiratory                         rate, oxygen saturations, blood pressure, adequacy of                         pulmonary ventilation, and response to care were                         monitored throughout the procedure. The physical                         status of the patient was re-assessed after the                         procedure.                        After obtaining informed consent, the endoscope was                         passed under direct vision. Throughout the procedure,                         the patient's blood pressure, pulse, and oxygen                         saturations were monitored continuously. The Endoscope                         was introduced through the mouth, and advanced to the  second part of duodenum. The upper GI endoscopy was                         accomplished without difficulty. The patient tolerated                         the procedure well. Findings:      The examined esophagus was normal.      There is no endoscopic evidence of varices in the entire esophagus.      A single 5 mm angioectasia with no bleeding was found in the gastric       body.      The exam of the stomach was otherwise normal.      A few small angioectasias without bleeding were found in the second       portion of the duodenum.      The exam of the duodenum was otherwise normal. Impression:            - Normal  esophagus.                        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                        - A few non-bleeding angioectasias in the duodenum.                        - No specimens collected. Recommendation:        - Perform a colonoscopy today. Since the AVM's were                         not actively bleeding, they were not treated.                         Recommend maximizing iron therapy and if persistently                         iron deficient, can repeat EGD at time of follow-up                         colonoscopy with plan to APC lesions. Procedure Code(s):     --- Professional ---                        [phone], Esophagogastroduodenoscopy, flexible,                         transoral; diagnostic, including collection of                         specimen(s) by brushing or washing, when performed                         (separate procedure) Diagnosis Code(s):     --- Professional ---                        B14.782, Angiodysplasia of stomach and duodenum  without bleeding                        D50.9, Iron deficiency anemia, unspecified CPT copyright 2022 American Medical Association. All rights reserved. The codes documented in this report are preliminary and upon coder review may  be revised to meet current compliance requirements. Andrey Farmer MD, MD 02/11/2022 8:20:14 AM Number of Addenda: 0 Note Initiated On: 02/11/2022 7:24 AM Estimated Blood Loss:  Estimated blood loss: none.      [facility]

## 2022-02-14 ENCOUNTER — Encounter: Payer: Self-pay | Admitting: Gastroenterology

## 2022-03-03 NOTE — Anesthesia Postprocedure Evaluation (Signed)
Anesthesia Post Note  Patient: Associate Professor  Procedure(s) Performed: COLONOSCOPY WITH PROPOFOL ESOPHAGOGASTRODUODENOSCOPY (EGD) WITH PROPOFOL  Patient location during evaluation: Endoscopy Anesthesia Type: General Level of consciousness: awake and alert Pain management: pain level controlled Vital Signs Assessment: post-procedure vital signs reviewed and stable Respiratory status: spontaneous breathing, nonlabored ventilation, respiratory function stable and patient connected to nasal cannula oxygen Cardiovascular status: blood pressure returned to baseline and stable Postop Assessment: no apparent nausea or vomiting Anesthetic complications: no   No notable events documented.   Last Vitals:  Vitals:   02/11/22 0828 02/11/22 0838  BP: (!) 110/52 111/64  Pulse: 66 68  Resp: 17 14  Temp: (!) 35.6 C   SpO2: 100% 100%    Last Pain:  Vitals:   02/11/22 0838  TempSrc:   PainSc: 0-No pain                 Martha Clan

## 2022-03-27 ENCOUNTER — Other Ambulatory Visit: Payer: Self-pay | Admitting: Acute Care

## 2022-03-27 DIAGNOSIS — F17210 Nicotine dependence, cigarettes, uncomplicated: Secondary | ICD-10-CM

## 2022-03-27 DIAGNOSIS — Z87891 Personal history of nicotine dependence: Secondary | ICD-10-CM

## 2022-03-27 DIAGNOSIS — Z122 Encounter for screening for malignant neoplasm of respiratory organs: Secondary | ICD-10-CM

## 2022-04-25 ENCOUNTER — Ambulatory Visit
Admission: RE | Admit: 2022-04-25 | Discharge: 2022-04-25 | Disposition: A | Discharge status: Home / Self Care | Payer: Medicare HMO | Source: Ambulatory Visit | Attending: Internal Medicine | Admitting: Internal Medicine

## 2022-04-25 DIAGNOSIS — Z87891 Personal history of nicotine dependence: Secondary | ICD-10-CM | POA: Diagnosis present

## 2022-04-25 DIAGNOSIS — F17210 Nicotine dependence, cigarettes, uncomplicated: Secondary | ICD-10-CM

## 2022-04-25 DIAGNOSIS — Z122 Encounter for screening for malignant neoplasm of respiratory organs: Secondary | ICD-10-CM

## 2022-04-28 ENCOUNTER — Telehealth: Payer: Self-pay | Admitting: Acute Care

## 2022-04-28 NOTE — Telephone Encounter (Signed)
Received call report from Tiffany with [facility] on patient's LCS CT done on 04/25/22. Sarah, please review the result/impression copied below:  IMPRESSION: 1. New left upper lobe nodule categorized as Lung-RADS 4A, suspicious. Follow up low-dose chest CT without contrast in 3 months (please use the following order, "CT CHEST LCS NODULE FOLLOW-UP W/O CM") is recommended. 2. Aortic atherosclerosis. 3. Mild diffuse bronchial wall thickening with mild centrilobular and paraseptal emphysema; imaging findings suggestive of underlying COPD.  Please advise, thank you.    **Also routing to lung nodule pool**

## 2022-04-29 ENCOUNTER — Telehealth: Payer: Self-pay | Admitting: Acute Care

## 2022-04-29 DIAGNOSIS — Z87891 Personal history of nicotine dependence: Secondary | ICD-10-CM

## 2022-04-29 DIAGNOSIS — R911 Solitary pulmonary nodule: Secondary | ICD-10-CM

## 2022-04-29 NOTE — Telephone Encounter (Signed)
I have called the patient with the results of her low-dose screening CT.  I explained that her scan showed that there is a new nodule in her left upper lobe that measures 6.6 mm that was not there on the previous scan.  This is a suspicious finding and therefore we will do a repeat scan in 3 months and then the patient will follow-up with Dr. Jayme Cloud in the [facility] pulmonary office to review the results of the scan. Patient is agreement in agreement with this plan. Denise please place 9-month follow-up low-dose CT scan for the outpatient imaging center in [place] as this is patient's preference. Once scan is scheduled she will need to be scheduled for consult with Dr. Jayme Cloud in the [facility] pulmonary office to review the results of the scan. This is per Dr. Georgann Housekeeper request after she reviewed the initial scan on 04/28/2022. Please fax results to PCP so that they are aware of the plan. Thank you so much

## 2022-04-29 NOTE — Telephone Encounter (Signed)
Results/plans faxed to PCP. Order placed for 3 mth nodule f/u low dose CT with notes to schedule consult appt with Dr Jayme Cloud after CT.

## 2022-05-04 ENCOUNTER — Other Ambulatory Visit: Payer: Self-pay | Admitting: Gastroenterology

## 2022-05-04 DIAGNOSIS — K7460 Unspecified cirrhosis of liver: Secondary | ICD-10-CM

## 2022-06-07 ENCOUNTER — Other Ambulatory Visit: Payer: Medicare HMO

## 2022-07-25 ENCOUNTER — Ambulatory Visit
Admission: RE | Admit: 2022-07-25 | Discharge: 2022-07-25 | Disposition: A | Discharge status: Home / Self Care | Payer: Medicare HMO | Source: Ambulatory Visit | Attending: Acute Care | Admitting: Acute Care

## 2022-07-25 DIAGNOSIS — R911 Solitary pulmonary nodule: Secondary | ICD-10-CM | POA: Diagnosis present

## 2022-07-25 DIAGNOSIS — Z87891 Personal history of nicotine dependence: Secondary | ICD-10-CM | POA: Diagnosis present

## 2022-07-29 ENCOUNTER — Telehealth: Payer: Self-pay | Admitting: Acute Care

## 2022-07-29 DIAGNOSIS — Z87891 Personal history of nicotine dependence: Secondary | ICD-10-CM

## 2022-07-29 DIAGNOSIS — Z122 Encounter for screening for malignant neoplasm of respiratory organs: Secondary | ICD-10-CM

## 2022-07-29 DIAGNOSIS — F17210 Nicotine dependence, cigarettes, uncomplicated: Secondary | ICD-10-CM

## 2022-07-29 NOTE — Telephone Encounter (Signed)
I have attempted to call the patient with the results of their  Low Dose CT Chest Lung cancer screening scan. There was no answer. I have left a HIPPA compliant VM requesting the patient call the office for the scan results. I included the office contact information in the message. We will await her return call. If no return call we will continue to call until patient is contacted.    Pt will need a 6 month follow up LDCT. This scan had gone from a 4A to a 3, nodule of concern has decreased in size from 6.6 mm to 4.7 mm, but there is a new small right middle lobe measuring 2.6 mm . Please fax results to PCP and let them know plan for follow up. Thanks so much

## 2022-08-03 NOTE — Telephone Encounter (Signed)
Called and spoke to patient regarding her LDCT results from 07/25/2022. Discussed results regarding the decrease in the previously concerning nodule and the new smaller nodule on this most recent scan. Advised patient the recommendation to have a repeat scan in 6 months to re-evaluate the new smaller nodule instead of waiting 12 months. Patient is agreeable to plan. Order placed for scan to be due in 01/2023. Pt verbalized understanding and denied any further questions or concerns at this time.

## 2022-08-17 ENCOUNTER — Other Ambulatory Visit: Payer: Self-pay | Admitting: Internal Medicine

## 2022-08-17 DIAGNOSIS — Z1231 Encounter for screening mammogram for malignant neoplasm of breast: Secondary | ICD-10-CM

## 2022-09-19 ENCOUNTER — Ambulatory Visit: Payer: Medicare HMO | Admitting: Anesthesiology

## 2022-09-19 ENCOUNTER — Ambulatory Visit
Admission: RE | Admit: 2022-09-19 | Discharge: 2022-09-19 | Disposition: A | Discharge status: Home / Self Care | Payer: Medicare HMO | Attending: Gastroenterology | Admitting: Gastroenterology

## 2022-09-19 ENCOUNTER — Encounter
Admission: RE | Disposition: A | Discharge status: Home / Self Care | Payer: Self-pay | Source: Home / Self Care | Attending: Gastroenterology

## 2022-09-19 DIAGNOSIS — Z79899 Other long term (current) drug therapy: Secondary | ICD-10-CM | POA: Insufficient documentation

## 2022-09-19 DIAGNOSIS — K7460 Unspecified cirrhosis of liver: Secondary | ICD-10-CM | POA: Diagnosis not present

## 2022-09-19 DIAGNOSIS — D509 Iron deficiency anemia, unspecified: Secondary | ICD-10-CM | POA: Diagnosis not present

## 2022-09-19 DIAGNOSIS — K640 First degree hemorrhoids: Secondary | ICD-10-CM | POA: Diagnosis not present

## 2022-09-19 DIAGNOSIS — Z1211 Encounter for screening for malignant neoplasm of colon: Secondary | ICD-10-CM | POA: Insufficient documentation

## 2022-09-19 DIAGNOSIS — F17200 Nicotine dependence, unspecified, uncomplicated: Secondary | ICD-10-CM | POA: Insufficient documentation

## 2022-09-19 DIAGNOSIS — D123 Benign neoplasm of transverse colon: Secondary | ICD-10-CM | POA: Diagnosis not present

## 2022-09-19 DIAGNOSIS — I10 Essential (primary) hypertension: Secondary | ICD-10-CM | POA: Insufficient documentation

## 2022-09-19 DIAGNOSIS — Z8619 Personal history of other infectious and parasitic diseases: Secondary | ICD-10-CM | POA: Insufficient documentation

## 2022-09-19 DIAGNOSIS — Z86010 Personal history of colonic polyps: Secondary | ICD-10-CM | POA: Diagnosis present

## 2022-09-19 HISTORY — PX: COLONOSCOPY WITH PROPOFOL: SHX5780

## 2022-09-19 HISTORY — PX: POLYPECTOMY: SHX5525

## 2022-09-19 SURGERY — COLONOSCOPY WITH PROPOFOL
Anesthesia: General

## 2022-09-19 MED ORDER — LIDOCAINE HCL (PF) 2 % IJ SOLN
INTRAMUSCULAR | Status: AC
  Filled 2022-09-19: qty 5

## 2022-09-19 MED ORDER — PROPOFOL 10 MG/ML IV BOLUS
INTRAVENOUS | Status: DC | PRN
  Administered 2022-09-19: 100 mg via INTRAVENOUS
  Administered 2022-09-19: 150 ug/kg/min via INTRAVENOUS

## 2022-09-19 MED ORDER — LIDOCAINE HCL (CARDIAC) PF 100 MG/5ML IV SOSY
PREFILLED_SYRINGE | INTRAVENOUS | Status: DC | PRN
  Administered 2022-09-19: 100 mg via INTRAVENOUS

## 2022-09-19 MED ORDER — PROPOFOL 10 MG/ML IV BOLUS
INTRAVENOUS | Status: AC
  Filled 2022-09-19: qty 40

## 2022-09-19 MED ORDER — SODIUM CHLORIDE 0.9 % IV SOLN
INTRAVENOUS | Status: DC
  Administered 2022-09-19: 20 mL/h via INTRAVENOUS

## 2022-09-19 NOTE — Anesthesia Preprocedure Evaluation (Signed)
Anesthesia Evaluation  Patient identified by MRN, date of birth, ID band Patient awake    Reviewed: Allergy & Precautions, H&P , NPO status , Patient's Chart, lab work & pertinent test results, reviewed documented beta blocker date and time   Airway Mallampati: II   Neck ROM: full    Dental  (+) Poor Dentition   Pulmonary neg pulmonary ROS, Current SmokerPatient did not abstain from smoking.   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 No date: Hypertension No date: Tubular adenoma of colon Past Surgical History: 09/06/2019: BREAST BIOPSY; Right     Comment:  [place] Bx, X-clip, BENIGN FATTY BREAST TISSUE AND BLOOD CLOT 10/30/2015: COLONOSCOPY; N/A     Comment:  Procedure: COLONOSCOPY;  Surgeon: Christena Deem, MD;              Location: [facility] ENDOSCOPY;  Service: Endoscopy;                Laterality: N/A; 07/03/2015: COLONOSCOPY WITH PROPOFOL; N/A     Comment:  Procedure: COLONOSCOPY WITH PROPOFOL;  Surgeon: Christena Deem, MD;  Location: [facility] ENDOSCOPY;  Service:               Endoscopy;  Laterality: N/A; 10/19/2017: COLONOSCOPY WITH PROPOFOL; N/A     Comment:  Procedure: COLONOSCOPY WITH PROPOFOL;  Surgeon:               Christena Deem, MD;  Location: [facility] ENDOSCOPY;                Service: Endoscopy;  Laterality:  N/A; 02/11/2022: COLONOSCOPY WITH PROPOFOL; N/A     Comment:  Procedure: COLONOSCOPY WITH PROPOFOL;  Surgeon:               [name], MD;  Location: [facility] ENDOSCOPY;                Service: Endoscopy;  Laterality: N/A; 10/19/2017: ESOPHAGOGASTRODUODENOSCOPY; N/A     Comment:  Procedure: ESOPHAGOGASTRODUODENOSCOPY (EGD);  Surgeon:               Christena Deem, MD;  Location: [facility] ENDOSCOPY;                Service: Endoscopy;  Laterality: N/A; 09/05/2019: ESOPHAGOGASTRODUODENOSCOPY; N/A     Comment:  Procedure: ESOPHAGOGASTRODUODENOSCOPY (EGD);  Surgeon:               Tol[place], Boykin Nearing, MD;  Location: [facility] ENDOSCOPY;                Service: Gastroenterology;  Laterality: N/A; 02/11/2022: ESOPHAGOGASTRODUODENOSCOPY (EGD) WITH PROPOFOL; N/A     Comment:  Procedure: ESOPHAGOGASTRODUODENOSCOPY (EGD) WITH               PROPOFOL;  Surgeon: [name], MD;  Location:  [facility] ENDOSCOPY;  Service: Endoscopy;  Laterality: N/A; 1980s: TUBAL LIGATION BMI    Body Mass Index: 27.07 kg/m     Reproductive/Obstetrics negative OB ROS                             Anesthesia Physical Anesthesia Plan  ASA: 2  Anesthesia Plan: General   Post-op Pain Management:    Induction:   PONV Risk Score and Plan:   Airway Management Planned:   Additional Equipment:   Intra-op Plan:   Post-operative Plan:   Informed Consent: I have reviewed the patients History and Physical, chart, labs and discussed the procedure including the risks, benefits and alternatives for the proposed anesthesia with the patient or authorized representative who has indicated his/her understanding and acceptance.     Dental Advisory Given  Plan Discussed with: CRNA  Anesthesia Plan Comments:        Anesthesia Quick Evaluation

## 2022-09-19 NOTE — Transfer of Care (Signed)
Immediate Anesthesia Transfer of Care Note  Patient: Claudia Pittman  Procedure(s) Performed: COLONOSCOPY WITH PROPOFOL POLYPECTOMY  Patient Location: Endoscopy Unit  Anesthesia Type:General  Level of Consciousness: drowsy  Airway & Oxygen Therapy: Patient Spontanous Breathing  Post-op Assessment: Report given to RN and Post -op Vital signs reviewed and stable  Post vital signs: Reviewed and stable  Last Vitals:  Vitals Value Taken Time  BP 116/66 09/19/22 0813  Temp 35.8 0813  Pulse 59 09/19/22 0813  Resp 17 09/19/22 0813  SpO2 100 % 09/19/22 0813    Last Pain:  Vitals:   09/19/22 0813  PainSc: Asleep         Complications: No notable events documented.

## 2022-09-19 NOTE — Anesthesia Postprocedure Evaluation (Signed)
Anesthesia Post Note  Patient: Human resources officer  Procedure(s) Performed: COLONOSCOPY WITH PROPOFOL POLYPECTOMY  Patient location during evaluation: PACU Anesthesia Type: General Level of consciousness: awake and alert Pain management: pain level controlled Vital Signs Assessment: post-procedure vital signs reviewed and stable Respiratory status: spontaneous breathing, nonlabored ventilation, respiratory function stable and patient connected to nasal cannula oxygen Cardiovascular status: blood pressure returned to baseline and stable Postop Assessment: no apparent nausea or vomiting Anesthetic complications: no   No notable events documented.   Last Vitals:  Vitals:   09/19/22 0813 09/19/22 0823  BP: 116/66 (!) 157/79  Pulse: (!) 59 (!) 48  Resp: 17 15  Temp:    SpO2: 100% 100%    Last Pain:  Vitals:   09/19/22 0823  PainSc: 0-No pain                 Yevette Edwards

## 2022-09-19 NOTE — H&P (Signed)
Outpatient short stay form Pre-procedure 09/19/2022  [name], MD  Primary Physician: Barbette Reichmann, MD  Reason for visit:  IDA  History of present illness:    72 y/o lady with history of hepatitis C cirrhosis with SVR and hypertension here for EGD/Colonoscopy for IDA. Last colonoscopy in 2024 with poor prep. No blood thinners. No family history of GI malignancies. Denies any abdominal surgeries.     Current Facility-Administered Medications:    0.9 %  sodium chloride infusion, , Intravenous, Continuous, Zykeem Bauserman, Rossie Muskrat, MD, Last Rate: 20 mL/hr at 09/19/22 0702, Continued from Pre-op at 09/19/22 0702  Medications Prior to Admission  Medication Sig Dispense Refill Last Dose   alum & mag hydroxide-simeth (MAALOX/MYLANTA) 200-200-20 MG/5ML suspension Take 15 mLs by mouth 3 (three) times daily.   09/18/2022   amLODipine (NORVASC) 5 MG tablet Take 5 mg by mouth daily.   09/19/2022 at 0500   Cholecalciferol 1000 units capsule Take 1,000 Units by mouth daily.   09/18/2022   Dorzolamide HCl-Timolol Mal PF 22.3-6.8 MG/ML SOLN Place 1 drop into both eyes 2 (two) times daily.   09/18/2022   LACTULOSE PO Take 30 mLs by mouth in the morning and at bedtime.   09/18/2022   latanoprost (XALATAN) 0.005 % ophthalmic solution Place 1 drop into both eyes at bedtime.   09/18/2022   Ledipasvir-Sofosbuvir (HARVONI) 90-400 MG TABS Take 1 tablet by mouth daily. (Patient not taking: Reported on 09/05/2019)      sucralfate (CARAFATE) 1 GM/10ML suspension Take 1 g by mouth 2 (two) times daily. 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    Physical Exam  Gen: Alert, oriented. Appears stated age.  HEENT: PERRLA. Lungs: No respiratory distress CV: RRR Abd:  soft, benign, no masses Ext: No edema    Planned procedures: Proceed with colonoscopy. The patient understands the nature of the planned procedure, indications, risks, alternatives and potential complications including but not limited to bleeding, infection, perforation, damage to internal organs and possible oversedation/side effects from anesthesia. The patient agrees and gives consent to proceed.  Please refer to procedure notes for findings, recommendations and patient disposition/instructions.     [name], MD [facility]

## 2022-09-19 NOTE — Interval H&P Note (Signed)
History and Physical Interval Note:  09/19/2022 7:49 AM  Claudia Pittman  has presented today for surgery, with the diagnosis of History of adenomatous polyp of the colon Cirrhosis of the liver.  The various methods of treatment have been discussed with the patient and family. After consideration of risks, benefits and other options for treatment, the patient has consented to  Procedure(s): COLONOSCOPY WITH PROPOFOL (N/A) as a surgical intervention.  The patient's history has been reviewed, patient examined, no change in status, stable for surgery.  I have reviewed the patient's chart and labs.  Questions were answered to the patient's satisfaction.     [name]  Ok to proceed with colonoscopy

## 2022-09-19 NOTE — Group Note (Deleted)

## 2022-09-19 NOTE — Op Note (Signed)
[facility] Patient Name: Claudia Pittman Procedure Date: 09/19/2022 7:18 AM MRN: [id number] Account #: [id number] Date of Birth: Oct 04, 1950 Admit Type: Outpatient Age: 72 Room: [facility] ENDO ROOM 3 Gender: Female Note Status: Supervisor Override Instrument Name: Prentice Docker [id number] Procedure:             Colonoscopy Indications:           High risk colon cancer surveillance: Personal history                         of colonic polyps, Iron deficiency anemia Providers:             Eather Colas MD, MD Referring MD:          Barbette Reichmann, MD (Referring MD) Medicines:             Monitored Anesthesia Care Complications:         No immediate complications. Estimated blood loss:                         Minimal. Procedure:             Pre-Anesthesia Assessment:                        - Prior to the procedure, a History and Physical was                         performed, and patient medications and allergies were                         reviewed. The patient is competent. The risks and                         benefits of the procedure and the sedation options and                         risks were discussed with the patient. All questions                         were answered and informed consent was obtained.                         Patient identification and proposed procedure were                         verified by the physician, the nurse, the                         anesthesiologist, the anesthetist and the technician                         in the endoscopy suite. Mental Status Examination:                         alert and oriented. Airway Examination: normal                         oropharyngeal airway and neck mobility. Respiratory  Examination: clear to auscultation. CV Examination:                         normal. Prophylactic Antibiotics: The patient does not                         require prophylactic antibiotics.  Prior                         Anticoagulants: The patient has taken no anticoagulant                         or antiplatelet agents. ASA Grade Assessment: III - A                         patient with severe systemic disease. After reviewing                         the risks and benefits, the patient was deemed in                         satisfactory condition to undergo the procedure. The                         anesthesia plan was to use monitored anesthesia care                         (MAC). Immediately prior to administration of                         medications, the patient was re-assessed for adequacy                         to receive sedatives. The heart rate, respiratory                         rate, oxygen saturations, blood pressure, adequacy of                         pulmonary ventilation, and response to care were                         monitored throughout the procedure. The physical                         status of the patient was re-assessed after the                         procedure.                        After obtaining informed consent, the colonoscope was                         passed under direct vision. Throughout the procedure,                         the patient's blood pressure, pulse, and oxygen  saturations were monitored continuously. The                         Colonoscope was introduced through the anus and                         advanced to the the cecum, identified by appendiceal                         orifice and ileocecal valve. The colonoscopy was                         performed without difficulty. The patient tolerated                         the procedure well. The quality of the bowel                         preparation was good. The ileocecal valve, appendiceal                         orifice, and rectum were photographed. Findings:      The perianal and digital rectal examinations were normal.      A tattoo was  seen at the hepatic flexure and in the distal ascending       colon. The tattoo site appeared normal.      A 3 mm polyp was found in the proximal transverse colon. The polyp was       sessile. The polyp was removed with a cold snare. Resection and       retrieval were complete. Estimated blood loss was minimal.      Internal hemorrhoids were found during retroflexion. The hemorrhoids       were Grade I (internal hemorrhoids that do not prolapse).      The exam was otherwise without abnormality on direct and retroflexion       views. Impression:            - A tattoo was seen at the hepatic flexure and in the                         distal ascending colon. The tattoo site appeared                         normal.                        - One 3 mm polyp in the proximal transverse colon,                         removed with a cold snare. Resected and retrieved.                        - Internal hemorrhoids.                        - The examination was otherwise normal on direct and                         retroflexion views. Recommendation:        -  Discharge patient to home.                        - Resume previous diet.                        - Continue present medications.                        - Await pathology results.                        - Repeat colonoscopy in 5 years for surveillance.                        - Return to referring physician as previously                         scheduled. Procedure Code(s):     --- Professional ---                        [phone], Colonoscopy, flexible; with removal of                         tumor(s), polyp(s), or other lesion(s) by snare                         technique Diagnosis Code(s):     --- Professional ---                        K64.0, First degree hemorrhoids                        D12.3, Benign neoplasm of transverse colon (hepatic                         flexure or splenic flexure)                        D50.9, Iron deficiency anemia,  unspecified CPT copyright 2022 [employer]. All rights reserved. The codes documented in this report are preliminary and upon coder review may  be revised to meet current compliance requirements. Eather Colas MD, MD 09/19/2022 8:15:33 AM Number of Addenda: 0 Note Initiated On: 09/19/2022 7:18 AM Scope Withdrawal Time: 0 hours 8 minutes 15 seconds  Total Procedure Duration: 0 hours 11 minutes 32 seconds  Estimated Blood Loss:  Estimated blood loss was minimal.      [facility]

## 2022-09-20 ENCOUNTER — Encounter: Payer: Self-pay | Admitting: Gastroenterology

## 2022-09-26 ENCOUNTER — Ambulatory Visit
Admission: RE | Admit: 2022-09-26 | Discharge: 2022-09-26 | Disposition: A | Discharge status: Home / Self Care | Payer: Medicare HMO | Source: Ambulatory Visit | Attending: Internal Medicine | Admitting: Internal Medicine

## 2022-09-26 DIAGNOSIS — Z1231 Encounter for screening mammogram for malignant neoplasm of breast: Secondary | ICD-10-CM | POA: Insufficient documentation

## 2023-01-10 ENCOUNTER — Other Ambulatory Visit: Payer: Self-pay

## 2023-01-25 ENCOUNTER — Ambulatory Visit
Admission: RE | Admit: 2023-01-25 | Discharge: 2023-01-25 | Disposition: A | Discharge status: Home / Self Care | Payer: Medicare HMO | Source: Ambulatory Visit | Attending: Acute Care | Admitting: Acute Care

## 2023-01-25 DIAGNOSIS — Z122 Encounter for screening for malignant neoplasm of respiratory organs: Secondary | ICD-10-CM | POA: Insufficient documentation

## 2023-01-25 DIAGNOSIS — F17210 Nicotine dependence, cigarettes, uncomplicated: Secondary | ICD-10-CM | POA: Diagnosis present

## 2023-01-25 DIAGNOSIS — Z87891 Personal history of nicotine dependence: Secondary | ICD-10-CM | POA: Insufficient documentation

## 2023-02-03 ENCOUNTER — Other Ambulatory Visit: Payer: Self-pay | Admitting: Emergency Medicine

## 2023-02-03 DIAGNOSIS — Z87891 Personal history of nicotine dependence: Secondary | ICD-10-CM

## 2023-02-03 DIAGNOSIS — Z122 Encounter for screening for malignant neoplasm of respiratory organs: Secondary | ICD-10-CM

## 2023-02-03 DIAGNOSIS — F17210 Nicotine dependence, cigarettes, uncomplicated: Secondary | ICD-10-CM

## 2023-06-19 ENCOUNTER — Other Ambulatory Visit: Payer: Self-pay | Admitting: Gastroenterology

## 2023-06-19 DIAGNOSIS — K7460 Unspecified cirrhosis of liver: Secondary | ICD-10-CM

## 2023-11-09 ENCOUNTER — Other Ambulatory Visit: Payer: Self-pay | Admitting: Internal Medicine

## 2023-11-09 DIAGNOSIS — Z1231 Encounter for screening mammogram for malignant neoplasm of breast: Secondary | ICD-10-CM

## 2023-12-04 ENCOUNTER — Other Ambulatory Visit: Payer: Self-pay | Admitting: Gastroenterology

## 2023-12-04 DIAGNOSIS — K7460 Unspecified cirrhosis of liver: Secondary | ICD-10-CM

## 2023-12-13 ENCOUNTER — Ambulatory Visit

## 2023-12-14 ENCOUNTER — Ambulatory Visit
Admission: RE | Admit: 2023-12-14 | Discharge: 2023-12-14 | Disposition: A | Source: Ambulatory Visit | Attending: Gastroenterology | Admitting: Gastroenterology

## 2023-12-14 DIAGNOSIS — K7460 Unspecified cirrhosis of liver: Secondary | ICD-10-CM | POA: Insufficient documentation

## 2023-12-15 ENCOUNTER — Ambulatory Visit
Admission: RE | Admit: 2023-12-15 | Discharge: 2023-12-15 | Disposition: A | Source: Ambulatory Visit | Attending: Internal Medicine | Admitting: Internal Medicine

## 2023-12-15 DIAGNOSIS — Z1231 Encounter for screening mammogram for malignant neoplasm of breast: Secondary | ICD-10-CM

## 2024-01-26 ENCOUNTER — Ambulatory Visit: Admission: RE | Admit: 2024-01-26 | Source: Ambulatory Visit
